# Patient Record
Sex: Female | Born: 1960 | Race: White | Hispanic: No | Marital: Married | Smoking: Never smoker
Health system: Southern US, Community
[De-identification: ages and names within clinical notes are randomized; demographics above are authoritative.]

## PROBLEM LIST (undated history)

## (undated) DIAGNOSIS — E538 Deficiency of other specified B group vitamins: Secondary | ICD-10-CM

## (undated) DIAGNOSIS — R569 Unspecified convulsions: Secondary | ICD-10-CM

## (undated) DIAGNOSIS — J309 Allergic rhinitis, unspecified: Secondary | ICD-10-CM

## (undated) DIAGNOSIS — E785 Hyperlipidemia, unspecified: Secondary | ICD-10-CM

## (undated) DIAGNOSIS — I8390 Asymptomatic varicose veins of unspecified lower extremity: Secondary | ICD-10-CM

## (undated) DIAGNOSIS — G5600 Carpal tunnel syndrome, unspecified upper limb: Secondary | ICD-10-CM

## (undated) HISTORY — DX: Carpal tunnel syndrome, unspecified upper limb: G56.00

## (undated) HISTORY — DX: Hyperlipidemia, unspecified: E78.5

## (undated) HISTORY — DX: Allergic rhinitis, unspecified: J30.9

## (undated) HISTORY — DX: Deficiency of other specified B group vitamins: E53.8

## (undated) HISTORY — DX: Unspecified convulsions: R56.9

## (undated) HISTORY — PX: VARICOSE VEIN SURGERY: SHX832

## (undated) HISTORY — DX: Asymptomatic varicose veins of unspecified lower extremity: I83.90

---

## 1998-05-19 ENCOUNTER — Other Ambulatory Visit: Admission: RE | Admit: 1998-05-19 | Discharge: 1998-05-19 | Payer: Self-pay | Admitting: Obstetrics & Gynecology

## 1999-06-07 ENCOUNTER — Other Ambulatory Visit: Admission: RE | Admit: 1999-06-07 | Discharge: 1999-06-07 | Payer: Self-pay | Admitting: Obstetrics & Gynecology

## 1999-09-21 ENCOUNTER — Other Ambulatory Visit: Admission: RE | Admit: 1999-09-21 | Discharge: 1999-09-21 | Payer: Self-pay | Admitting: Obstetrics & Gynecology

## 2000-10-16 ENCOUNTER — Other Ambulatory Visit: Admission: RE | Admit: 2000-10-16 | Discharge: 2000-10-16 | Payer: Self-pay | Admitting: Obstetrics & Gynecology

## 2001-12-20 ENCOUNTER — Other Ambulatory Visit: Admission: RE | Admit: 2001-12-20 | Discharge: 2001-12-20 | Payer: Self-pay | Admitting: Obstetrics & Gynecology

## 2003-01-12 ENCOUNTER — Other Ambulatory Visit: Admission: RE | Admit: 2003-01-12 | Discharge: 2003-01-12 | Payer: Self-pay | Admitting: Obstetrics & Gynecology

## 2005-04-04 ENCOUNTER — Other Ambulatory Visit: Admission: RE | Admit: 2005-04-04 | Discharge: 2005-04-04 | Payer: Self-pay | Admitting: Obstetrics & Gynecology

## 2009-01-11 ENCOUNTER — Ambulatory Visit: Payer: Self-pay | Admitting: Cardiology

## 2009-01-11 DIAGNOSIS — R0602 Shortness of breath: Secondary | ICD-10-CM | POA: Insufficient documentation

## 2009-05-05 DEATH — deceased

## 2010-06-24 ENCOUNTER — Ambulatory Visit
Admission: RE | Admit: 2010-06-24 | Discharge: 2010-06-24 | Payer: Self-pay | Source: Home / Self Care | Attending: Internal Medicine | Admitting: Internal Medicine

## 2010-06-24 ENCOUNTER — Other Ambulatory Visit: Payer: Self-pay | Admitting: Internal Medicine

## 2010-06-24 DIAGNOSIS — R569 Unspecified convulsions: Secondary | ICD-10-CM | POA: Insufficient documentation

## 2010-06-24 DIAGNOSIS — R5383 Other fatigue: Secondary | ICD-10-CM

## 2010-06-24 DIAGNOSIS — G4710 Hypersomnia, unspecified: Secondary | ICD-10-CM | POA: Insufficient documentation

## 2010-06-24 DIAGNOSIS — R5381 Other malaise: Secondary | ICD-10-CM | POA: Insufficient documentation

## 2010-06-24 DIAGNOSIS — G5600 Carpal tunnel syndrome, unspecified upper limb: Secondary | ICD-10-CM | POA: Insufficient documentation

## 2010-06-24 DIAGNOSIS — Z9189 Other specified personal risk factors, not elsewhere classified: Secondary | ICD-10-CM | POA: Insufficient documentation

## 2010-06-24 LAB — BASIC METABOLIC PANEL
BUN: 12 mg/dL (ref 6–23)
CO2: 27 mEq/L (ref 19–32)
Calcium: 9.3 mg/dL (ref 8.4–10.5)
Chloride: 107 mEq/L (ref 96–112)
Creatinine, Ser: 0.8 mg/dL (ref 0.4–1.2)
GFR: 85.38 mL/min (ref >60.00)
Glucose, Bld: 82 mg/dL (ref 70–99)
Potassium: 4.4 mEq/L (ref 3.5–5.1)
Sodium: 139 mEq/L (ref 135–145)

## 2010-06-24 LAB — CBC WITH DIFFERENTIAL/PLATELET
Basophils Absolute: 0 10*3/uL (ref 0.0–0.1)
Basophils Relative: 0.5 % (ref 0.0–3.0)
Eosinophils Absolute: 0.1 10*3/uL (ref 0.0–0.7)
Eosinophils Relative: 0.8 % (ref 0.0–5.0)
HCT: 39 % (ref 36.0–46.0)
Hemoglobin: 13.6 g/dL (ref 12.0–15.0)
Lymphocytes Relative: 24.9 % (ref 12.0–46.0)
Lymphs Abs: 2 10*3/uL (ref 0.7–4.0)
MCHC: 34.9 g/dL (ref 30.0–36.0)
MCV: 91 fl (ref 78.0–100.0)
Monocytes Absolute: 0.5 10*3/uL (ref 0.1–1.0)
Monocytes Relative: 6.3 % (ref 3.0–12.0)
Neutro Abs: 5.4 10*3/uL (ref 1.4–7.7)
Neutrophils Relative %: 67.5 % (ref 43.0–77.0)
Platelets: 370 10*3/uL (ref 150.0–400.0)
RBC: 4.29 Mil/uL (ref 3.87–5.11)
RDW: 12.6 % (ref 11.5–14.6)
WBC: 8 10*3/uL (ref 4.5–10.5)

## 2010-06-24 LAB — HEPATIC FUNCTION PANEL
ALT: 20 U/L (ref 0–35)
AST: 19 U/L (ref 0–37)
Albumin: 4 g/dL (ref 3.5–5.2)
Alkaline Phosphatase: 39 U/L (ref 39–117)
Bilirubin, Direct: 0.1 mg/dL (ref 0.0–0.3)
Total Bilirubin: 0.7 mg/dL (ref 0.3–1.2)
Total Protein: 6.9 g/dL (ref 6.0–8.3)

## 2010-06-24 LAB — TSH: TSH: 0.86 u[IU]/mL (ref 0.35–5.50)

## 2010-06-24 LAB — B12 AND FOLATE PANEL
Folate: 21.6 ng/mL (ref >5.9)
Vitamin B-12: 156 pg/mL — ABNORMAL LOW (ref 211–911)

## 2010-06-26 DIAGNOSIS — E538 Deficiency of other specified B group vitamins: Secondary | ICD-10-CM | POA: Insufficient documentation

## 2010-06-26 DIAGNOSIS — J309 Allergic rhinitis, unspecified: Secondary | ICD-10-CM | POA: Insufficient documentation

## 2010-06-29 ENCOUNTER — Ambulatory Visit
Admission: RE | Admit: 2010-06-29 | Discharge: 2010-06-29 | Payer: Self-pay | Source: Home / Self Care | Attending: Internal Medicine | Admitting: Internal Medicine

## 2010-07-07 NOTE — Assessment & Plan Note (Signed)
Summary: New / [employer] / #/ cd   Vital Signs:  Patient profile:   50 year old female Height:      66.5 inches (168.91 cm) Weight:      150.8 pounds (68.55 kg) BMI:     24.06 O2 Sat:      98 % on Room air Temp:     98.3 degrees F (36.83 degrees C) oral Pulse rate:   71 / minute BP sitting:   110 / 70  (left arm) Cuff size:   regular  Vitals Entered By: Orlan Leavens RMA (June 24, 2010 2:31 PM)  O2 Flow:  Room air CC: New patient Is Patient Diabetic? No Pain Assessment Patient in pain? no        Primary Care Provider:  Newt Lukes MD  CC:  New patient.  History of Present Illness: new pt to me and our practice, here to est care prior primary care needs have been through gyn, dr. Jennette Kettle  c/o fatigue onset 2 mo ago (04/2010) - course slightly improved in past 2 weeks but not yet back to her normal energy decribes as physical fatigue> motivational no depression symptoms or signs excess sleep - see next no med changes or diet changes - ?b12 defic as +FH same no HA, CP, abd pain, muscle weakness, vision change or new stressors  c/o hypersomnia - sleeps well at night >8h/sleep falls asleep "very easy" when not mentally engagaed (afterwork, after dinner, driving home) freq naps - never feels rested or alert describes prodromal sleep "i have to hurry dinner becuase i'll fall asleep before its done" - no snoring or awakening that pt is aware of - no med change s- no accidents or work problems related to sleeping no known narcolepsy or other sleeping disorder but ?for same - (remote sz as child with prodrome and numbness - ?relation to same - off phenobarb since age 66y w/o recurrent sz symptoms)    Preventive Screening-Counseling & Management  Alcohol-Tobacco     Alcohol drinks/day: <1     Alcohol Counseling: not indicated; use of alcohol is not excessive or problematic     Smoking Status: never     Tobacco Counseling: not indicated; no tobacco  use  Caffeine-Diet-Exercise     Does Patient Exercise: yes     Exercise Counseling: not indicated; exercise is adequate     Depression Counseling: not indicated; screening negative for depression  Safety-Violence-Falls     Seat Belt Counseling: not indicated; patient wears seat belts     Helmet Counseling: not indicated; patient wears helmet when riding bicycle/motocycle     Firearm Counseling: not indicated; uses recommended firearm safety measures     Violence Counseling: not indicated; no violence risk noted     Fall Risk Counseling: not indicated; no significant falls noted  Current Medications (verified): 1)  Aspirin 81 Mg Tbec (Aspirin) .... Once Daily 2)  Allegra Allergy 180 Mg Tabs (Fexofenadine Hcl) .... Take 1 By Mouth Once Daily 3)  Fish Oil 1000 Mg Caps (Omega-3 Fatty Acids) .... Take 1 By Mouth Once Daily 4)  Vitamin D 1000 Unit Tabs (Cholecalciferol) .... Take 1 By Mouth Once Daily 5)  Calcium 600 Mg Tabs (Calcium) .... Take 1 By Mouth Once Daily 6)  Loestrin 24 Fe 1-20 Mg-Mcg Tabs (Norethin Ace-Eth Estrad-Fe) .... Take 1 By Mouth Once Daily  Allergies (verified): 1)  ! Keflex 2)  ! Sulfa 3)  ! Augmentin  Past History:  Past Medical History:  remote sizure disorder - age 44y, off pheonbarb since age 63y Allergic rhinitis  MD roster: gyn - neal derm - turner  Past Surgical History: Denies surgical history  Family History: Family History Diabetes 1st degree relative (parent) Family History Hypertension (parent)  mom expired 08/2009 - dementia complications dad expired  Social History: Never Smoked, social/occ alcohol married, lives with spouse and 2 kids works as Psychologist, prison and probation services (part time) Smoking Status:  never Does Patient Exercise:  yes  Review of Systems       see HPI above. I have reviewed all other systems and they were negative.   Physical Exam  General:  fit, alert, well-developed, well-nourished, and cooperative to examination.     Head:  Normocephalic and atraumatic without obvious abnormalities. No apparent alopecia or balding. Eyes:  vision grossly intact; pupils equal, round and reactive to light.  conjunctiva and lids normal.    Ears:  normal pinnae bilaterally, without erythema, swelling, or tenderness to palpation. TMs clear, without effusion, or cerumen impaction. Hearing grossly normal bilaterally  Mouth:  teeth and gums in good repair; mucous membranes moist, without lesions or ulcers. oropharynx clear without exudate, no erythema.  Neck:  supple, full ROM, no masses, no thyromegaly; no thyroid nodules or tenderness. no JVD or carotid bruits.   Lungs:  normal respiratory effort, no intercostal retractions or use of accessory muscles; normal breath sounds bilaterally - no crackles and no wheezes.    Heart:  normal rate, regular rhythm, no murmur, and no rub. BLE without edema.  Abdomen:  soft, non-tender, normal bowel sounds, no distention; no masses and no appreciable hepatomegaly or splenomegaly.   Genitalia:  defer to gyn Msk:  No deformity or scoliosis noted of thoracic or lumbar spine.   Neurologic:  alert & oriented X3 and cranial nerves II-XII symetrically intact.  strength normal in all extremities, sensation intact to light touch, and gait normal. speech fluent without dysarthria or aphasia; follows commands with good comprehension.  Skin:  no rashes, vesicles, ulcers, or erythema. No nodules or irregularity to palpation.  Psych:  Oriented X3, memory intact for recent and remote, normally interactive, good eye contact, not anxious appearing, not depressed appearing, and not agitated.      Impression & Recommendations:  Problem # 1:  FATIGUE (ICD-780.79) nonspecific hx and exam - ?sleep disorder - see next lab screening now -  reassurance and plans to eval same reviewed with pt today, no med changes recommended at this time Orders: TLB-BMP (Basic Metabolic Panel-BMET) (80048-METABOL) TLB-CBC Platelet -  w/Differential (85025-CBCD) TLB-Hepatic/Liver Function Pnl (80076-HEPATIC) TLB-TSH (Thyroid Stimulating Hormone) (84443-TSH) TLB-B12 + Folate Pnl (11914_78295-A21/HYQ)  Problem # 2:  HYPERSOMNIA (ICD-780.54) easily falls asleep, never not tired - may be contrib to fatigue symptoms  refer to pulm for sleep eval and anticipated sleep study - Orders: Pulmonary Referral (Pulmonary)  Problem # 3:  ALLERGIC RHINITIS (ICD-477.9) on allegra for years -  no other sedating meds that may be contrib to sedation and fatigue issues Her updated medication list for this problem includes:    Allegra Allergy 180 Mg Tabs (Fexofenadine hcl) .[name]... Take 1 by mouth once daily  Complete Medication List: 1)  Aspirin 81 Mg Tbec (Aspirin) .... Once daily 2)  Allegra Allergy 180 Mg Tabs (Fexofenadine hcl) .... Take 1 by mouth once daily 3)  Fish Oil 1000 Mg Caps (Omega-3 fatty acids) .... Take 1 by mouth once daily 4)  Vitamin D 1000 Unit Tabs (Cholecalciferol) .... Take 1 by mouth  once daily 5)  Calcium 600 Mg Tabs (Calcium) .... Take 1 by mouth once daily 6)  Loestrin 24 Fe 1-20 Mg-mcg Tabs (Norethin ace-eth estrad-fe) .... Take 1 by mouth once daily  Patient Instructions: 1)  it was good to see you today. 2)  history and medications reviewed today 3)  test(s) ordered today - your results will be called to you after review in 48-72 hours from the time of test completion; if any changes need to be made or there are abnormal results, you will be  notified at that time 4)  we'll make referral to sleep specialist. Our office will contact you regarding this appointment once made.  5)  Please schedule a follow-up appointment in 3 months to review symptoms, call sooner if problems.    Orders Added: 1)  TLB-BMP (Basic Metabolic Panel-BMET) [80048-METABOL] 2)  TLB-CBC Platelet - w/Differential [85025-CBCD] 3)  TLB-Hepatic/Liver Function Pnl [80076-HEPATIC] 4)  TLB-TSH (Thyroid Stimulating Hormone) [84443-TSH] 5)   TLB-B12 + Folate Pnl [[id number]-B12/FOL] 6)  Pulmonary Referral [Pulmonary] 7)  New Patient Level III [16109]

## 2010-07-07 NOTE — Assessment & Plan Note (Signed)
Summary: PER LUCY B12 INJ--VL--STC  Nurse Visit   Allergies: 1)  ! Keflex 2)  ! Sulfa 3)  ! Augmentin  Medication Administration  Injection # 1:    Medication: Vit B12 1000 mcg    Diagnosis: VITAMIN B12 DEFICIENCY (ICD-266.2)    Route: IM    Site: L deltoid    Exp Date: 04/05/2012    Lot #: [id number]    Mfr: American Regent    Given by: Margaret Pyle, CMA (June 29, 2010 9:50 AM)  Orders Added: 1)  Admin of Therapeutic Inj  intramuscular or subcutaneous [96372] 2)  Vit B12 1000 mcg [J3420]

## 2010-07-08 ENCOUNTER — Ambulatory Visit (INDEPENDENT_AMBULATORY_CARE_PROVIDER_SITE_OTHER): Payer: Managed Care, Other (non HMO)

## 2010-07-08 ENCOUNTER — Encounter: Payer: Self-pay | Admitting: Internal Medicine

## 2010-07-08 DIAGNOSIS — E538 Deficiency of other specified B group vitamins: Secondary | ICD-10-CM

## 2010-07-13 NOTE — Assessment & Plan Note (Signed)
Summary: B-12 VAL  Nurse Visit   Allergies: 1)  ! Keflex 2)  ! Sulfa 3)  ! Augmentin  Medication Administration  Injection # 1:    Medication: Vit B12 1000 mcg    Diagnosis: VITAMIN B12 DEFICIENCY (ICD-266.2)    Route: IM    Site: LUOQ gluteus    Exp Date: 04/05/2012    Lot #: [id number]    Mfr: American Regent    Patient tolerated injection without complications    Given by: Margaret Pyle, CMA (July 08, 2010 10:39 AM)  Orders Added: 1)  Admin of Therapeutic Inj  intramuscular or subcutaneous [96372] 2)  Vit B12 1000 mcg [J3420]

## 2010-07-18 ENCOUNTER — Encounter: Payer: Self-pay | Admitting: Internal Medicine

## 2010-07-18 ENCOUNTER — Ambulatory Visit (INDEPENDENT_AMBULATORY_CARE_PROVIDER_SITE_OTHER): Payer: Managed Care, Other (non HMO)

## 2010-07-18 ENCOUNTER — Institutional Professional Consult (permissible substitution): Payer: Self-pay | Admitting: Pulmonary Disease

## 2010-07-18 DIAGNOSIS — E538 Deficiency of other specified B group vitamins: Secondary | ICD-10-CM

## 2010-07-25 ENCOUNTER — Ambulatory Visit: Payer: Managed Care, Other (non HMO)

## 2010-07-27 NOTE — Assessment & Plan Note (Signed)
Summary: PER PT B12 INJ D/T--VL  STC  Nurse Visit   Allergies: 1)  ! Keflex 2)  ! Sulfa 3)  ! Augmentin  Medication Administration  Injection # 1:    Medication: Vit B12 1000 mcg    Diagnosis: VITAMIN B12 DEFICIENCY (ICD-266.2)    Route: IM    Site: R deltoid    Exp Date: 04/2012    Lot #: [id number]    Mfr: American Regent    Patient tolerated injection without complications    Given by: Brenton Grills CMA (AAMA) (July 18, 2010 9:11 AM)  Orders Added: 1)  Admin of Therapeutic Inj  intramuscular or subcutaneous [96372] 2)  Vit B12 1000 mcg [J3420]

## 2010-08-02 ENCOUNTER — Institutional Professional Consult (permissible substitution) (INDEPENDENT_AMBULATORY_CARE_PROVIDER_SITE_OTHER): Payer: Managed Care, Other (non HMO) | Admitting: Pulmonary Disease

## 2010-08-02 ENCOUNTER — Institutional Professional Consult (permissible substitution): Payer: Self-pay | Admitting: Pulmonary Disease

## 2010-08-02 ENCOUNTER — Encounter: Payer: Self-pay | Admitting: Pulmonary Disease

## 2010-08-02 DIAGNOSIS — R5381 Other malaise: Secondary | ICD-10-CM

## 2010-08-16 NOTE — Assessment & Plan Note (Signed)
Summary: consult for hypersomnia    Copy to:  Rene Paci Primary Provider/Referring Provider:  Newt Lukes MD  CC:  Sleep Consult for tiredness during the day, snoring, and and falls asleep easily. [name]  History of Present Illness: The pt is a 50y/o female who I have been asked to see for possible osa.  +snoring, abnormal breathing pattern during sleep have been noted.  Sleeps btw 10p - 5-7am.  She feels rested upon arising, but has had mild "sleepiness issues" during the day for at least a few yrs now.  She describes fatigue >> sleepiness overall.  Her symptoms are not significant enough to interfere with work, and does not keep her from getting things done in the evening.  She rarely has sleepiness with driving except on occasion in the afternoons.  She did not have sleepiness issues during young adulthood.  She denies any RLS symptoms during the night, and no one has commented on kicking during sleep.  She did have a h/o seizures as a child, but has not  been an issue in adulthood.  Her epworth score today is abnormal at 16.  Medications Prior to Update: 1)  Aspirin 81 Mg Tbec (Aspirin) .... Once Daily 2)  Allegra Allergy 180 Mg Tabs (Fexofenadine Hcl) .... Take 1 By Mouth Once Daily 3)  Fish Oil 1000 Mg Caps (Omega-3 Fatty Acids) .... Take 1 By Mouth Once Daily 4)  Vitamin D 1000 Unit Tabs (Cholecalciferol) .... Take 1 By Mouth Once Daily 5)  Calcium 600 Mg Tabs (Calcium) .... Take 1 By Mouth Once Daily 6)  Loestrin 24 Fe 1-20 Mg-Mcg Tabs (Norethin Ace-Eth Estrad-Fe) .... Take 1 By Mouth Once Daily 7)  Cyanocobalamin 1000 Mcg/ml Soln (Cyanocobalamin) .... Im Weekly X 4 Weeks, Then Monthly  Allergies (verified): 1)  ! Keflex 2)  ! Sulfa 3)  ! Augmentin  Past History:  Past Medical History: Reviewed history from 06/24/2010 and no changes required. remote sizure disorder - age 50y, off pheonbarb since age 50y Allergic rhinitis  MD roster: gyn - neal derm - turner  Past  Surgical History: Reviewed history from 06/24/2010 and no changes required. Denies surgical history  Family History: Reviewed history from 06/24/2010 and no changes required. Family History Diabetes 1st degree relative (parent) Family History Hypertension (parent)  mom expired 08/2009 - dementia complications. also had heart disease, melanoma dad expired 37 years old.   Social History: Reviewed history from 06/24/2010 and no changes required. smoked very rarely x 1 year while in college.  social/occ alcohol married,  lives with spouse and 2 kids works as Psychologist, prison and probation services (part time)  Review of Systems       The patient complains of weight change.  The patient denies shortness of breath with activity, shortness of breath at rest, productive cough, non-productive cough, coughing up blood, chest pain, irregular heartbeats, acid heartburn, indigestion, loss of appetite, abdominal pain, difficulty swallowing, sore throat, tooth/dental problems, headaches, nasal congestion/difficulty breathing through nose, sneezing, itching, ear ache, anxiety, depression, hand/feet swelling, joint stiffness or pain, rash, change in color of mucus, and fever.    Vital Signs:  Patient profile:   50 year old female Height:      66.5 inches Weight:      153 pounds BMI:     24.41 O2 Sat:      99 % on Room air Temp:     98.8 degrees F oral Pulse rate:   77 / minute BP sitting:   110 /  76  (left arm) Cuff size:   regular  Vitals Entered By: Arman Filter LPN (August 02, 2010 1:36 PM)  O2 Flow:  Room air CC: Sleep Consult for tiredness during the day, snoring, and falls asleep easily.  Comments Medications reviewed with patient. Arman Filter LPN  August 02, 2010 1:45 PM    Physical Exam  General:  wd female in nad  Eyes:  PERRLA and EOMI.   Nose:  mildly narrowed, no purulence or obstruction Mouth:  elongation of uvula, soft palate normal  Neck:  no jvd, tmg, LN Lungs:  totally clear to  auscultation Heart:  rrr, no mrg Abdomen:  soft and nontender, bs+ Extremities:  no edema or cyanosis  pulses intact distally Neurologic:  alert and oriented, moves all 4    Impression & Recommendations:  Problem # 1:  FATIGUE (ICD-780.79)  the pt's history seems to be more c/w fatigue rather than true sleepiness.  She does not have the body habitus for osa, denies symptoms of RLS, and has not been noted to have unusual behaviors at night by her husband.  She does have an abnormal epworth score however.  I have told her the only way to determine whether she has a sleep disorder contributing to this is to do a sleep study.  She is unsure whether she wants to proceed, but will give it some thought.    Other Orders: Consultation Level IV (52841)  Patient Instructions: 1)  please continue to think about whether this is fatigue vs. sleepiness. 2)  let me know if you are having ongoing symptoms, and would like to proceed with sleep study.

## 2010-08-24 ENCOUNTER — Encounter: Payer: Self-pay | Admitting: *Deleted

## 2010-08-24 ENCOUNTER — Ambulatory Visit (INDEPENDENT_AMBULATORY_CARE_PROVIDER_SITE_OTHER): Payer: Managed Care, Other (non HMO) | Admitting: Internal Medicine

## 2010-08-24 DIAGNOSIS — E538 Deficiency of other specified B group vitamins: Secondary | ICD-10-CM

## 2010-08-24 MED ORDER — CYANOCOBALAMIN 1000 MCG/ML IJ SOLN
1000.0000 ug | Freq: Once | INTRAMUSCULAR | Status: AC
  Administered 2010-08-24: 1000 ug via INTRAMUSCULAR

## 2010-09-23 ENCOUNTER — Ambulatory Visit (INDEPENDENT_AMBULATORY_CARE_PROVIDER_SITE_OTHER): Payer: Managed Care, Other (non HMO) | Admitting: *Deleted

## 2010-09-23 DIAGNOSIS — E538 Deficiency of other specified B group vitamins: Secondary | ICD-10-CM

## 2010-09-23 MED ORDER — CYANOCOBALAMIN 1000 MCG/ML IJ SOLN
1000.0000 ug | Freq: Once | INTRAMUSCULAR | Status: AC
  Administered 2010-09-23: 1000 ug via INTRAMUSCULAR

## 2010-09-27 ENCOUNTER — Ambulatory Visit: Payer: Self-pay | Admitting: Internal Medicine

## 2010-09-28 ENCOUNTER — Ambulatory Visit (INDEPENDENT_AMBULATORY_CARE_PROVIDER_SITE_OTHER): Payer: Managed Care, Other (non HMO) | Admitting: Internal Medicine

## 2010-09-28 ENCOUNTER — Encounter: Payer: Self-pay | Admitting: Internal Medicine

## 2010-09-28 ENCOUNTER — Other Ambulatory Visit: Payer: Managed Care, Other (non HMO)

## 2010-09-28 VITALS — BP 100/62 | HR 73 | Temp 98.1°F | Ht 66.5 in | Wt 149.8 lb

## 2010-09-28 DIAGNOSIS — E538 Deficiency of other specified B group vitamins: Secondary | ICD-10-CM

## 2010-09-28 DIAGNOSIS — R5381 Other malaise: Secondary | ICD-10-CM

## 2010-09-28 DIAGNOSIS — G5600 Carpal tunnel syndrome, unspecified upper limb: Secondary | ICD-10-CM

## 2010-09-28 MED ORDER — CYANOCOBALAMIN 1000 MCG PO TABS: 1000.0000 ug | ORAL_TABLET | Freq: Every day | ORAL | Status: DC

## 2010-09-28 NOTE — Patient Instructions (Signed)
It was good to see you today. Test(s) ordered today to check for celiac disease. Your results will be called to you after review (48-72hours after test completion). If any changes need to be made, you will be notified at that time. 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

## 2010-09-29 ENCOUNTER — Telehealth: Payer: Self-pay | Admitting: Internal Medicine

## 2010-09-29 NOTE — Assessment & Plan Note (Signed)
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

## 2010-09-29 NOTE — Assessment & Plan Note (Signed)
?  malabsorbtion - check for possible celiac dz - TTGab - given ongoing fatigue Many neuropathy and fatigue symptoms improving Continue IM qmo and add oral B12 supplements

## 2010-09-29 NOTE — Assessment & Plan Note (Signed)
B12 defic identified 06/2010, no other metabolic or nutitional prolems found - slowly improved Reassurance provided and labs/hx reviewed with pt today

## 2010-09-29 NOTE — Progress Notes (Signed)
  Subjective:    Patient ID: Mandy Watkins, female    DOB: 10/28/1960, 50 y.o.   MRN: [id number]  HPI  Here for follow up Seen 06/2010 for fatigue - dx with B12 deficiency - started on IM replacemnt for same - improved Also saw pulm sleep specialist - symptoms felt related to fatigue rather than sleeping problem (hypersomnia)  complains of "numb burning itching pain" on pad of 4th finger tip of right hand symptoms present >38mo, intermittent course and wax/wane intensity of symptoms Denies precipitating injury or trauma to area - no weaknes, skin change or rash Prior numbness in feet improved since starting B12  Allergic rhinitis - uses OTC meds for symptoms control  Past Medical History  Diagnosis Date  . VITAMIN B12 DEFICIENCY dx 06/26/2010  . ALLERGIC RHINITIS   . CARPAL TUNNEL SYNDROME, RIGHT    Review of Systems  Constitutional: Negative for unexpected weight change.  Respiratory: Negative for shortness of breath.   Cardiovascular: Negative for chest pain.  Gastrointestinal: Negative for abdominal pain, diarrhea and abdominal distention.  Neurological: Negative for seizures, syncope and headaches.       Objective:   Physical Exam  Constitutional: She is oriented to person, place, and time. She appears well-developed and well-nourished.  Cardiovascular: Normal rate, regular rhythm and normal heart sounds.   Pulmonary/Chest: Effort normal and breath sounds normal. No respiratory distress.  Musculoskeletal: Normal range of motion.  Neurological: She is alert and oriented to person, place, and time. No cranial nerve deficit. Coordination normal.  Psychiatric: She has a normal mood and affect. Her behavior is normal.  BP 100/62  Pulse 73  Temp(Src) 98.1 F (36.7 C) (Oral)  Ht 5' 6.5" (1.689 m)  Wt 149 lb 12.8 oz (67.949 kg)  BMI 23.82 kg/m2  SpO2 98% Lab Results  Component Value Date   WBC 8.0 06/24/2010   HGB 13.6 06/24/2010   HCT 39.0 06/24/2010   PLT 370.0  06/24/2010   ALT 20 06/24/2010   AST 19 06/24/2010   NA 139 06/24/2010   K 4.4 06/24/2010   CL 107 06/24/2010   CREATININE 0.8 06/24/2010   BUN 12 06/24/2010   CO2 27 06/24/2010   TSH 0.86 06/24/2010   Lab Results  Component Value Date   VITAMINB12 156* 06/24/2010       Assessment & Plan:  Fatigue with B12 deficency - dx 06/2010 - improving but not resolved with replacement - advised oral B12 as well as IM q mo - will also check for celiac dz (occ bloat, rash) Fingertip parasthesia - ?glomus tumor - advised education and hand eval if symptoms affecting life quality or fx - pt will consider Otherwise, See problem list. Medications and labs reviewed today.

## 2010-09-29 NOTE — Telephone Encounter (Signed)
Please call patient - normal test results, no gluten antibodies - no evidence for celiac dz. Would still consider trying gluten free diet to see if diet change helps her fatigue. No medication changes recommended. Thanks.

## 2010-09-30 NOTE — Telephone Encounter (Signed)
Called pt no ansew LMOM RTC.Marland KitchenMarland Kitchen4/27/12@2 :19pm/LMB

## 2010-10-03 NOTE — Telephone Encounter (Signed)
Called pt no ansew LMOM RTC ASAP concerning labs.Marland KitchenMarland Kitchen4/30/12@12 :55pm/LMB

## 2010-10-03 NOTE — Telephone Encounter (Signed)
Pt return call back gave results concening labs.Mandy KitchenMarland Kitchen4/30/12@1 :55pm/LMB

## 2010-10-24 ENCOUNTER — Ambulatory Visit (INDEPENDENT_AMBULATORY_CARE_PROVIDER_SITE_OTHER): Payer: Managed Care, Other (non HMO)

## 2010-10-24 DIAGNOSIS — E538 Deficiency of other specified B group vitamins: Secondary | ICD-10-CM

## 2010-10-24 DIAGNOSIS — Z111 Encounter for screening for respiratory tuberculosis: Secondary | ICD-10-CM

## 2010-10-24 MED ORDER — CYANOCOBALAMIN 1000 MCG/ML IJ SOLN
1000.0000 ug | Freq: Once | INTRAMUSCULAR | Status: AC
  Administered 2010-10-24: 1000 ug via INTRAMUSCULAR

## 2010-10-26 LAB — TB SKIN TEST: TB Skin Test: NEGATIVE mm

## 2010-10-27 ENCOUNTER — Encounter: Payer: Self-pay | Admitting: Internal Medicine

## 2010-11-22 ENCOUNTER — Encounter: Payer: Self-pay | Admitting: Internal Medicine

## 2010-11-23 ENCOUNTER — Ambulatory Visit (INDEPENDENT_AMBULATORY_CARE_PROVIDER_SITE_OTHER): Payer: Managed Care, Other (non HMO)

## 2010-11-23 DIAGNOSIS — E538 Deficiency of other specified B group vitamins: Secondary | ICD-10-CM

## 2010-11-23 MED ORDER — CYANOCOBALAMIN 1000 MCG/ML IJ SOLN
1000.0000 ug | Freq: Once | INTRAMUSCULAR | Status: AC
  Administered 2010-11-23: 1000 ug via INTRAMUSCULAR

## 2010-12-23 ENCOUNTER — Ambulatory Visit (INDEPENDENT_AMBULATORY_CARE_PROVIDER_SITE_OTHER): Payer: Managed Care, Other (non HMO) | Admitting: *Deleted

## 2010-12-23 DIAGNOSIS — E538 Deficiency of other specified B group vitamins: Secondary | ICD-10-CM

## 2010-12-23 MED ORDER — CYANOCOBALAMIN 1000 MCG/ML IJ SOLN
1000.0000 ug | Freq: Once | INTRAMUSCULAR | Status: AC
  Administered 2010-12-23: 1000 ug via INTRAMUSCULAR

## 2011-01-23 ENCOUNTER — Ambulatory Visit (INDEPENDENT_AMBULATORY_CARE_PROVIDER_SITE_OTHER): Payer: Managed Care, Other (non HMO) | Admitting: *Deleted

## 2011-01-23 ENCOUNTER — Telehealth: Payer: Self-pay | Admitting: *Deleted

## 2011-01-23 DIAGNOSIS — E538 Deficiency of other specified B group vitamins: Secondary | ICD-10-CM

## 2011-01-23 MED ORDER — CYANOCOBALAMIN 1000 MCG/ML IJ SOLN
1000.0000 ug | Freq: Once | INTRAMUSCULAR | Status: AC
  Administered 2011-01-23: 1000 ug via INTRAMUSCULAR

## 2011-01-23 NOTE — Telephone Encounter (Signed)
Pt came in for B12 inj today. She is inquiring as to when she should have her b12 level checked. She states it was last checked in January. Please inform pt when she needs this checked.

## 2011-01-23 NOTE — Telephone Encounter (Signed)
Left mess for patient to call back.  

## 2011-01-23 NOTE — Telephone Encounter (Signed)
We can check this next month before pt has b12 shot administered - i have placed order for lab on 9/24 - pt should have lab drawn, then get the b12 shot - thanks

## 2011-01-31 NOTE — Telephone Encounter (Signed)
Left detailed mess informing pt of below.  

## 2011-02-23 ENCOUNTER — Other Ambulatory Visit (INDEPENDENT_AMBULATORY_CARE_PROVIDER_SITE_OTHER): Payer: Managed Care, Other (non HMO)

## 2011-02-23 ENCOUNTER — Ambulatory Visit (INDEPENDENT_AMBULATORY_CARE_PROVIDER_SITE_OTHER): Payer: Managed Care, Other (non HMO)

## 2011-02-23 DIAGNOSIS — E538 Deficiency of other specified B group vitamins: Secondary | ICD-10-CM

## 2011-02-23 MED ORDER — CYANOCOBALAMIN 1000 MCG/ML IJ SOLN
1000.0000 ug | Freq: Once | INTRAMUSCULAR | Status: AC
  Administered 2011-02-23: 1000 ug via INTRAMUSCULAR

## 2011-03-27 ENCOUNTER — Other Ambulatory Visit: Payer: Self-pay | Admitting: Internal Medicine

## 2011-03-27 ENCOUNTER — Encounter: Payer: Self-pay | Admitting: Internal Medicine

## 2011-03-27 ENCOUNTER — Ambulatory Visit (INDEPENDENT_AMBULATORY_CARE_PROVIDER_SITE_OTHER): Payer: Managed Care, Other (non HMO) | Admitting: Internal Medicine

## 2011-03-27 ENCOUNTER — Ambulatory Visit: Payer: Managed Care, Other (non HMO)

## 2011-03-27 ENCOUNTER — Telehealth: Payer: Self-pay | Admitting: *Deleted

## 2011-03-27 ENCOUNTER — Other Ambulatory Visit (INDEPENDENT_AMBULATORY_CARE_PROVIDER_SITE_OTHER): Payer: Managed Care, Other (non HMO)

## 2011-03-27 VITALS — BP 102/62 | HR 71 | Temp 97.9°F | Ht 66.5 in | Wt 147.1 lb

## 2011-03-27 DIAGNOSIS — Z0000 Encounter for general adult medical examination without abnormal findings: Secondary | ICD-10-CM

## 2011-03-27 DIAGNOSIS — Z23 Encounter for immunization: Secondary | ICD-10-CM

## 2011-03-27 DIAGNOSIS — E538 Deficiency of other specified B group vitamins: Secondary | ICD-10-CM

## 2011-03-27 DIAGNOSIS — Z1211 Encounter for screening for malignant neoplasm of colon: Secondary | ICD-10-CM

## 2011-03-27 DIAGNOSIS — R55 Syncope and collapse: Secondary | ICD-10-CM

## 2011-03-27 LAB — HEPATIC FUNCTION PANEL
AST: 28 U/L (ref 0–37)
Alkaline Phosphatase: 38 U/L — ABNORMAL LOW (ref 39–117)
Bilirubin, Direct: 0.2 mg/dL (ref 0.0–0.3)
Total Bilirubin: 1 mg/dL (ref 0.3–1.2)

## 2011-03-27 LAB — BASIC METABOLIC PANEL
BUN: 10 mg/dL (ref 6–23)
CO2: 25 mEq/L (ref 19–32)
Calcium: 9.2 mg/dL (ref 8.4–10.5)
Creatinine, Ser: 0.8 mg/dL (ref 0.4–1.2)
Glucose, Bld: 95 mg/dL (ref 70–99)
Sodium: 137 mEq/L (ref 135–145)

## 2011-03-27 LAB — CBC WITH DIFFERENTIAL/PLATELET
Basophils Absolute: 0 10*3/uL (ref 0.0–0.1)
Eosinophils Absolute: 0 10*3/uL (ref 0.0–0.7)
Hemoglobin: 14.5 g/dL (ref 12.0–15.0)
Lymphocytes Relative: 22.3 % (ref 12.0–46.0)
MCHC: 34.7 g/dL (ref 30.0–36.0)
Neutro Abs: 4.4 10*3/uL (ref 1.4–7.7)
Platelets: 353 10*3/uL (ref 150.0–400.0)
RDW: 12.7 % (ref 11.5–14.6)

## 2011-03-27 LAB — LIPID PANEL: Total CHOL/HDL Ratio: 3

## 2011-03-27 MED ORDER — CYANOCOBALAMIN 1000 MCG/ML IJ SOLN
1000.0000 ug | Freq: Once | INTRAMUSCULAR | Status: AC
  Administered 2011-03-27: 1000 ug via INTRAMUSCULAR

## 2011-03-27 NOTE — Patient Instructions (Signed)
It was good to see you today. Immunizations updated today + B12 shot done Test(s) ordered today. Your results will be called to you after review (48-72hours after test completion) and form completed for work pickup as discussed. If any changes need to be made, you will be notified at that time. we'll make referral for cardiac ultrasound due to "near" passing out symptoms . Our office will contact you regarding appointment(s) once made. Also refer for colonoscopy (age >50) -

## 2011-03-27 NOTE — Progress Notes (Signed)
Subjective:    Patient ID: Mandy Watkins, female    DOB: October 02, 1960, 50 y.o.   MRN: [id number]  HPI  patient is here today for annual physical. Patient feels well today.  Complains of nearly passing out "near syncope ". Onset event 5 days ago while driving History of same including tightness feeling in chest with shortness of breath Status post cardiac evaluation for same in 2010, felt to be anxiety related after negative eval No recurrence of symptoms in past 6 months until last week Sudden onset while sitting at rest, driving Describes chest tightness with "wind blowing in chest" feeling -  Symptoms radiated up neck into the head and caused blackening of vision Symptoms lasted 2-3 minutes then spontaneously resolved Denies shortness of breath, pain or current anxiety/stress  Also reviewed chronic medical issues:  B12 deficiency - dx 06/2010 - started on IM replacement for same - improved numbness and tingling sensation, improved fatigue. Also saw pulm sleep specialist - symptoms felt related to fatigue rather than sleeping problem (hypersomnia)   Past Medical History  Diagnosis Date  . VITAMIN B12 DEFICIENCY dx 06/26/2010  . ALLERGIC RHINITIS   . CARPAL TUNNEL SYNDROME, RIGHT   . SEIZURE DISORDER    Family History  Problem Relation Age of Onset  . Diabetes Mother   . Hypertension Mother   . Diabetes Father   . Hypertension Father    History  Substance Use Topics  . Smoking status: Never Smoker   . 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 Alcohol Use: Yes     Social    Review of Systems  Constitutional: Negative for unexpected weight change.  Respiratory: Negative for shortness of breath.   Cardiovascular: Negative for chest pain.  Gastrointestinal: Negative for abdominal pain, diarrhea and abdominal distention.  Neurological: Negative for seizures, syncope and headaches.  No  other specific complaints in a complete review of systems (except as listed in HPI above).      Objective:   Physical ExamBP 102/62  Pulse 71  Temp(Src) 97.9 F (36.6 C) (Oral)  Ht 5' 6.5" (1.689 m)  Wt 147 lb 1.9 oz (66.733 kg)  BMI 23.39 kg/m2  SpO2 98% Wt Readings from Last 3 Encounters:  03/27/11 147 lb 1.9 oz (66.733 kg)  09/28/10 149 lb 12.8 oz (67.949 kg)  08/02/10 153 lb (69.4 kg)   Constitutional: She appears well-developed and well-nourished. No distress.  HENT: Head: Normocephalic and atraumatic. Ears: B TMs ok, no erythema or effusion; Nose: Nose normal.  Mouth/Throat: Oropharynx is clear and moist. No oropharyngeal exudate.  Eyes: Conjunctivae and EOM are normal. Pupils are equal, round, and reactive to light. No scleral icterus.  Neck: Normal range of motion. Neck supple. No JVD present. No thyromegaly present.  Cardiovascular: Normal rate, regular rhythm and normal heart sounds.  No murmur heard. No BLE edema. Pulmonary/Chest: Effort normal and breath sounds normal. No respiratory distress. She has no wheezes.  Abdominal: Soft. Bowel sounds are normal. She exhibits no distension. There is no tenderness. no masses GU: defer to gyn Musculoskeletal: Normal range of motion, no joint effusions. No gross deformities Neurological: She is alert and oriented to person, place, and time. No cranial nerve deficit. Coordination normal.  Skin: Skin is warm and dry. No rash noted. No erythema.  Psychiatric: She has a normal mood and affect. Her behavior is normal. Judgment and thought content normal.  Lab Results  Component Value Date   WBC 8.0 06/24/2010   HGB 13.6 06/24/2010   HCT 39.0 06/24/2010   PLT 370.0 06/24/2010   ALT 20 06/24/2010   AST 19 06/24/2010   NA 139 06/24/2010   K 4.4 06/24/2010   CL 107 06/24/2010   CREATININE 0.8 06/24/2010   BUN 12 06/24/2010   CO2 27 06/24/2010   TSH 0.86 06/24/2010   Lab Results  Component Value Date   VITAMINB12 600 02/23/2011   EKG:  Sinus rhythm at 75 beats per minute. No ischemic changes or arrhythmias     Assessment & Plan:   CPX- v70.0 - Patient has been counseled on age-appropriate routine health concerns for screening and prevention. These are reviewed and up-to-date. Immunizations are up-to-date or declined. Labs ordered and will be reviewed. Will complete forms for health screen for employer once labs returned  Near syncope. History of same with negative cardiac evaluation 2010. Recurrence of events 5 days ago with increasing severity of symptoms. EKG today unremarkable for acute event but will refer for echocardiogram to evaluate for structural or valvular changes. Exam and vitals stable today, reassurance provided.

## 2011-03-27 NOTE — Telephone Encounter (Signed)
Pt walk in requesting to have labs done prior to appt today @ 1:30. Pt has a health form from work that is requesting her cholesterol levels. Pt had labs done back in 06/24/10, but know lipid panel was done....03/27/11@9 :15am/LMB

## 2011-03-27 NOTE — Telephone Encounter (Signed)
Pt was notified md entered labs went to have blood work done...03/27/11@9 :58am/LMB

## 2011-03-27 NOTE — Telephone Encounter (Signed)
Lipids ordered as well as full CPX labs (will bill as CPX visit this afternoon)

## 2011-03-28 ENCOUNTER — Encounter: Payer: Self-pay | Admitting: Internal Medicine

## 2011-03-31 ENCOUNTER — Encounter: Payer: Self-pay | Admitting: Gastroenterology

## 2011-04-04 ENCOUNTER — Ambulatory Visit (HOSPITAL_COMMUNITY): Payer: Managed Care, Other (non HMO) | Attending: Cardiology

## 2011-04-04 DIAGNOSIS — R55 Syncope and collapse: Secondary | ICD-10-CM

## 2011-04-04 DIAGNOSIS — R079 Chest pain, unspecified: Secondary | ICD-10-CM | POA: Insufficient documentation

## 2011-04-04 DIAGNOSIS — R0989 Other specified symptoms and signs involving the circulatory and respiratory systems: Secondary | ICD-10-CM | POA: Insufficient documentation

## 2011-04-04 DIAGNOSIS — R0609 Other forms of dyspnea: Secondary | ICD-10-CM | POA: Insufficient documentation

## 2011-04-12 ENCOUNTER — Ambulatory Visit (AMBULATORY_SURGERY_CENTER): Payer: Managed Care, Other (non HMO) | Admitting: *Deleted

## 2011-04-12 ENCOUNTER — Encounter: Payer: Self-pay | Admitting: Gastroenterology

## 2011-04-12 VITALS — Ht 66.5 in | Wt 148.6 lb

## 2011-04-12 DIAGNOSIS — Z1211 Encounter for screening for malignant neoplasm of colon: Secondary | ICD-10-CM

## 2011-04-12 MED ORDER — PEG-KCL-NACL-NASULF-NA ASC-C 100 G PO SOLR: ORAL | Status: DC

## 2011-04-26 ENCOUNTER — Encounter: Payer: Self-pay | Admitting: Gastroenterology

## 2011-04-26 ENCOUNTER — Ambulatory Visit (AMBULATORY_SURGERY_CENTER): Payer: Managed Care, Other (non HMO) | Admitting: Gastroenterology

## 2011-04-26 DIAGNOSIS — K5730 Diverticulosis of large intestine without perforation or abscess without bleeding: Secondary | ICD-10-CM

## 2011-04-26 DIAGNOSIS — Z1211 Encounter for screening for malignant neoplasm of colon: Secondary | ICD-10-CM

## 2011-04-26 MED ORDER — SODIUM CHLORIDE 0.9 % IV SOLN: 500.0000 mL | INTRAVENOUS | Status: DC

## 2011-04-26 NOTE — Patient Instructions (Signed)
Please refer to your blue and neon green sheets for instructions regarding diet and activity for the rest of today.  You may resume your medications as you would normally take them.   Diverticulosis Diverticulosis is a common condition that develops when small pouches (diverticula) form in the wall of the colon. The risk of diverticulosis increases with age. It happens more often in people who eat a low-fiber diet. Most individuals with diverticulosis have no symptoms. Those individuals with symptoms usually experience abdominal pain, constipation, or loose stools (diarrhea). HOME CARE INSTRUCTIONS   Increase the amount of fiber in your diet as directed by your caregiver or dietician. This may reduce symptoms of diverticulosis.   Your caregiver may recommend taking a dietary fiber supplement.   Drink at least 6 to 8 glasses of water each day to prevent constipation.   Try not to strain when you have a bowel movement.   Your caregiver may recommend avoiding nuts and seeds to prevent complications, although this is still an uncertain benefit.   Only take over-the-counter or prescription medicines for pain, discomfort, or fever as directed by your caregiver.  FOODS WITH HIGH FIBER CONTENT INCLUDE:  Fruits. Apple, peach, pear, tangerine, raisins, prunes.   Vegetables. Brussels sprouts, asparagus, broccoli, cabbage, carrot, cauliflower, romaine lettuce, spinach, summer squash, tomato, winter squash, zucchini.   Starchy Vegetables. Baked beans, kidney beans, lima beans, split peas, lentils, potatoes (with skin).   Grains. Whole wheat bread, brown rice, bran flake cereal, plain oatmeal, white rice, shredded wheat, bran muffins.  SEEK IMMEDIATE MEDICAL CARE IF:   You develop increasing pain or severe bloating.   You have an oral temperature above 102 F (38.9 C), not controlled by medicine.   You develop vomiting or bowel movements that are bloody or black.  Document Released: 02/17/2004  Document Revised: 02/01/2011 Document Reviewed: 10/20/2009 [employer] Patient Information 2012 [employer]. 

## 2011-04-26 NOTE — Progress Notes (Signed)
Redness was noted on patient's right wrist and forearm following the pattern of her veins after final dose of Benadryl. No swelling. IV running well. Sped up IV fluids and redness started lightening to a pink color. Patient shows no signs of discomfort. Continuing IV fluids throughout recovery time.  1152-Pt IV discontinued per protocol. Redness resolved. No c/o on pain. No tenderness/warmth to touch. Site WNL  Patient did not experience any of the following events: a burn prior to discharge; a fall within the facility; wrong site/side/patient/procedure/implant event; or a hospital transfer or hospital admission upon discharge from the facility. [phone])  Patient did not have preoperative order for IV antibiotic SSI prophylaxis. [phone])

## 2011-05-01 ENCOUNTER — Ambulatory Visit: Payer: Managed Care, Other (non HMO) | Admitting: *Deleted

## 2011-05-01 ENCOUNTER — Telehealth: Payer: Self-pay | Admitting: *Deleted

## 2011-05-01 DIAGNOSIS — E538 Deficiency of other specified B group vitamins: Secondary | ICD-10-CM

## 2011-05-01 MED ORDER — CYANOCOBALAMIN 1000 MCG/ML IJ SOLN
1000.0000 ug | Freq: Once | INTRAMUSCULAR | Status: AC
  Administered 2011-05-01: 1000 ug via INTRAMUSCULAR

## 2011-05-01 NOTE — Telephone Encounter (Signed)
No answer. Message left on voicemail. 

## 2011-06-01 ENCOUNTER — Ambulatory Visit (INDEPENDENT_AMBULATORY_CARE_PROVIDER_SITE_OTHER): Payer: Managed Care, Other (non HMO)

## 2011-06-01 ENCOUNTER — Telehealth: Payer: Self-pay

## 2011-06-01 DIAGNOSIS — E538 Deficiency of other specified B group vitamins: Secondary | ICD-10-CM

## 2011-06-01 MED ORDER — CYANOCOBALAMIN 1000 MCG/ML IJ SOLN
1000.0000 ug | Freq: Once | INTRAMUSCULAR | Status: AC
  Administered 2011-06-01: 1000 ug via INTRAMUSCULAR

## 2011-06-01 MED ORDER — "INSULIN SYRINGE-NEEDLE U-100 25G X 1"" 1 ML MISC": 1.0000 | Status: DC

## 2011-06-01 MED ORDER — CYANOCOBALAMIN 1000 MCG/ML IJ SOLN: 1000.0000 ug | INTRAMUSCULAR | Status: DC

## 2011-06-01 NOTE — Telephone Encounter (Signed)
Pt requests Rx for B-12 and syringes so her husband can start administrating injections. Per pt, spouse was in the [employer] and is able to give injections. Okay to Rx?

## 2011-06-01 NOTE — Telephone Encounter (Signed)
Yes thanks 

## 2011-06-06 LAB — HM PAP SMEAR

## 2011-06-06 LAB — HM MAMMOGRAPHY

## 2011-12-01 ENCOUNTER — Other Ambulatory Visit: Payer: Self-pay | Admitting: Internal Medicine

## 2012-02-14 ENCOUNTER — Telehealth: Payer: Self-pay | Admitting: *Deleted

## 2012-02-14 DIAGNOSIS — Z0000 Encounter for general adult medical examination without abnormal findings: Secondary | ICD-10-CM

## 2012-02-14 NOTE — Telephone Encounter (Signed)
Message copied by Merrilyn Puma on Wed Feb 14, 2012  4:36 PM ------      Message from: Oneal Grout      Created: Wed Feb 14, 2012  4:29 PM       Please put in CPE labs, pt has appt 04/03/12 Thanks

## 2012-02-14 NOTE — Telephone Encounter (Signed)
CPE labs entered.  

## 2012-04-03 ENCOUNTER — Encounter: Payer: Self-pay | Admitting: Internal Medicine

## 2012-04-03 ENCOUNTER — Ambulatory Visit (INDEPENDENT_AMBULATORY_CARE_PROVIDER_SITE_OTHER): Payer: Managed Care, Other (non HMO) | Admitting: Internal Medicine

## 2012-04-03 ENCOUNTER — Other Ambulatory Visit (INDEPENDENT_AMBULATORY_CARE_PROVIDER_SITE_OTHER): Payer: Managed Care, Other (non HMO)

## 2012-04-03 VITALS — BP 118/76 | HR 78 | Temp 98.4°F | Ht 66.0 in | Wt 152.0 lb

## 2012-04-03 DIAGNOSIS — Z23 Encounter for immunization: Secondary | ICD-10-CM

## 2012-04-03 DIAGNOSIS — Z0000 Encounter for general adult medical examination without abnormal findings: Secondary | ICD-10-CM

## 2012-04-03 LAB — LIPID PANEL
Cholesterol: 195 mg/dL (ref 0–200)
HDL: 64.1 mg/dL (ref >39.00)
LDL Cholesterol: 114 mg/dL — ABNORMAL HIGH (ref 0–99)
Triglycerides: 87 mg/dL (ref 0.0–149.0)
VLDL: 17.4 mg/dL (ref 0.0–40.0)

## 2012-04-03 LAB — BASIC METABOLIC PANEL
BUN: 9 mg/dL (ref 6–23)
Creatinine, Ser: 0.9 mg/dL (ref 0.4–1.2)
GFR: 73.52 mL/min (ref >60.00)
Glucose, Bld: 84 mg/dL (ref 70–99)
Potassium: 4.2 mEq/L (ref 3.5–5.1)

## 2012-04-03 LAB — HEPATIC FUNCTION PANEL
AST: 22 U/L (ref 0–37)
Albumin: 3.8 g/dL (ref 3.5–5.2)

## 2012-04-03 LAB — URINALYSIS, ROUTINE W REFLEX MICROSCOPIC
Ketones, ur: NEGATIVE
Total Protein, Urine: NEGATIVE
Urine Glucose: NEGATIVE
Urobilinogen, UA: 0.2 (ref 0.0–1.0)
pH: 7 (ref 5.0–8.0)

## 2012-04-03 LAB — CBC WITH DIFFERENTIAL/PLATELET
Basophils Absolute: 0 10*3/uL (ref 0.0–0.1)
Eosinophils Relative: 1.3 % (ref 0.0–5.0)
Lymphocytes Relative: 23 % (ref 12.0–46.0)
Monocytes Relative: 6.6 % (ref 3.0–12.0)
Neutrophils Relative %: 68.6 % (ref 43.0–77.0)
Platelets: 330 10*3/uL (ref 150.0–400.0)
RDW: 12.2 % (ref 11.5–14.6)
WBC: 8.2 10*3/uL (ref 4.5–10.5)

## 2012-04-03 LAB — TSH: TSH: 1.28 u[IU]/mL (ref 0.35–5.50)

## 2012-04-03 NOTE — Progress Notes (Signed)
Subjective:    Patient ID: Mandy Watkins, female    DOB: 02-15-1961, 51 y.o.   MRN: [id number]  HPI  patient is here today for annual physical. Patient feels well today.  Also reviewed chronic medical issues:  B12 deficiency - dx 06/2010 - started on IM replacement for same - improved numbness and tingling sensation, improved fatigue.    Past Medical History  Diagnosis Date  . VITAMIN B12 DEFICIENCY dx 06/26/2010  . ALLERGIC RHINITIS   . CARPAL TUNNEL SYNDROME, RIGHT   . SEIZURE DISORDER     as child   Family History  Problem Relation Age of Onset  . Diabetes Mother   . Hypertension Mother   . Diabetes Father   . Hypertension Father   . Colon cancer Neg Hx    History  Substance Use Topics  . Smoking status: Never Smoker   . 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 Alcohol Use: Yes     Social    Review of Systems  Constitutional: Negative for unexpected weight change.  Respiratory: Negative for shortness of breath.   Cardiovascular: Negative for chest pain.  Gastrointestinal: Negative for abdominal pain, diarrhea and abdominal distention.  Neurological: Negative for seizures, syncope and headaches.  No other specific complaints in a complete review of systems (except as listed in HPI above).      Objective:   Physical Exam BP 118/76  Pulse 78  Temp 98.4 F (36.9 C) (Oral)  Ht 5\' 6"  (1.676 m)  Wt 152 lb (68.947 kg)  BMI 24.53 kg/m2  SpO2 99% Wt Readings from Last 3 Encounters:  04/03/12 152 lb (68.947 kg)  04/26/11 148 lb (67.132 kg)  04/12/11 148 lb 9.6 oz (67.405 kg)   Constitutional: She appears well-developed and well-nourished. No distress.  HENT: Head: Normocephalic and atraumatic. Ears: B TMs ok, no erythema or effusion; Nose: Nose normal. Mouth/Throat: Oropharynx is clear and moist. No oropharyngeal exudate.  Eyes: Conjunctivae and EOM are normal. Pupils are  equal, round, and reactive to light. No scleral icterus.  Neck: Normal range of motion. Neck supple. No JVD present. No thyromegaly present.  Cardiovascular: Normal rate, regular rhythm and normal heart sounds.  No murmur heard. No BLE edema. Pulmonary/Chest: Effort normal and breath sounds normal. No respiratory distress. She has no wheezes.  Abdominal: Soft. Bowel sounds are normal. She exhibits no distension. There is no tenderness. no masses GU: defer to gyn Musculoskeletal: Normal range of motion, no joint effusions. No gross deformities Neurological: She is alert and oriented to person, place, and time. No cranial nerve deficit. Coordination normal.  Skin: Skin is warm and dry. No rash noted. No erythema.  Psychiatric: She has a normal mood and affect. Her behavior is normal. Judgment and thought content normal.    Lab Results  Component Value Date   WBC 8.2 04/03/2012   HGB 13.1 04/03/2012   HCT 39.2 04/03/2012   PLT 330.0 04/03/2012   CHOL 195 04/03/2012   TRIG 87.0 04/03/2012   HDL 64.10 04/03/2012   LDLDIRECT 116.9 03/27/2011   ALT 20 04/03/2012   AST 22 04/03/2012   NA 136 04/03/2012   K 4.2 04/03/2012   CL 105 04/03/2012   CREATININE 0.9 04/03/2012   BUN 9 04/03/2012   CO2 25 04/03/2012   TSH 1.28 04/03/2012   Lab Results  Component Value Date   VITAMINB12 600 02/23/2011   EKG:  Sinus rhythm at 76 beats per minute. No ischemic changes or arrhythmias. No change from October 2012     Assessment & Plan:   CPX- v70.0 - Patient has been counseled on age-appropriate routine health concerns for screening and prevention. These are reviewed and up-to-date. Immunizations are up-to-date or declined. Labs ordered and reviewed.

## 2012-04-03 NOTE — Patient Instructions (Signed)
It was good to see you today. Health Maintenance reviewed - flu shot today - all recommended immunizations and age-appropriate screenings are up-to-date. We have reviewed your prior records including labs and tests today Please schedule followup in 12 months, call sooner if problems. Health Maintenance, Females A healthy lifestyle and preventative care can promote health and wellness.  Maintain regular health, dental, and eye exams.   Eat a healthy diet. Foods like vegetables, fruits, whole grains, low-fat dairy products, and lean protein foods contain the nutrients you need without too many calories. Decrease your intake of foods high in solid fats, added sugars, and salt. Get information about a proper diet from your caregiver, if necessary.   Regular physical exercise is one of the most important things you can do for your health. Most adults should get at least 150 minutes of moderate-intensity exercise (any activity that increases your heart rate and causes you to sweat) each week. In addition, most adults need muscle-strengthening exercises on 2 or more days a week.     Maintain a healthy weight. The body mass index (BMI) is a screening tool to identify possible weight problems. It provides an estimate of body fat based on height and weight. Your caregiver can help determine your BMI, and can help you achieve or maintain a healthy weight. For adults 20 years and older:   A BMI below 18.5 is considered underweight.   A BMI of 18.5 to 24.9 is normal.   A BMI of 25 to 29.9 is considered overweight.   A BMI of 30 and above is considered obese.   Maintain normal blood lipids and cholesterol by exercising and minimizing your intake of saturated fat. Eat a balanced diet with plenty of fruits and vegetables. Blood tests for lipids and cholesterol should begin at age 79 and be repeated every 5 years. If your lipid or cholesterol levels are high, you are over 50, or you are a high risk for heart  disease, you may need your cholesterol levels checked more frequently. Ongoing high lipid and cholesterol levels should be treated with medicines if diet and exercise are not effective.   If you smoke, find out from your caregiver how to quit. If you do not use tobacco, do not start.   If you are pregnant, do not drink alcohol. If you are breastfeeding, be very cautious about drinking alcohol. If you are not pregnant and choose to drink alcohol, do not exceed 1 drink per day. One drink is considered to be 12 ounces (355 mL) of beer, 5 ounces (148 mL) of wine, or 1.5 ounces (44 mL) of liquor.   Avoid use of street drugs. Do not share needles with anyone. Ask for help if you need support or instructions about stopping the use of drugs.   High blood pressure causes heart disease and increases the risk of stroke. Blood pressure should be checked at least every 1 to 2 years. Ongoing high blood pressure should be treated with medicines, if weight loss and exercise are not effective.   If you are 28 to 52 years old, ask your caregiver if you should take aspirin to prevent strokes.   Diabetes screening involves taking a blood sample to check your fasting blood sugar level. This should be done once every 3 years, after age 77, if you are within normal weight and without risk factors for diabetes. Testing should be considered at a younger age or be carried out more frequently if you are overweight and  have at least 1 risk factor for diabetes.   Breast cancer screening is essential preventative care for women. You should practice "breast self-awareness." This means understanding the normal appearance and feel of your breasts and may include breast self-examination. Any changes detected, no matter how small, should be reported to a caregiver. Women in their 33s and 30s should have a clinical breast exam (CBE) by a caregiver as part of a regular health exam every 1 to 3 years. After age 73, women should have a CBE  every year. Starting at age 46, women should consider having a mammogram (breast X-ray) every year. Women who have a family history of breast cancer should talk to their caregiver about genetic screening. Women at a high risk of breast cancer should talk to their caregiver about having an MRI and a mammogram every year.   The Pap test is a screening test for cervical cancer. Women should have a Pap test starting at age 81. Between ages 69 and 51, Pap tests should be repeated every 2 years. Beginning at age 73, you should have a Pap test every 3 years as long as the past 3 Pap tests have been normal. If you had a hysterectomy for a problem that was not cancer or a condition that could lead to cancer, then you no longer need Pap tests. If you are between ages 23 and 72, and you have had normal Pap tests going back 10 years, you no longer need Pap tests. If you have had past treatment for cervical cancer or a condition that could lead to cancer, you need Pap tests and screening for cancer for at least 20 years after your treatment. If Pap tests have been discontinued, risk factors (such as a new sexual partner) need to be reassessed to determine if screening should be resumed. Some women have medical problems that increase the chance of getting cervical cancer. In these cases, your caregiver may recommend more frequent screening and Pap tests.   The human papillomavirus (HPV) test is an additional test that may be used for cervical cancer screening. The HPV test looks for the virus that can cause the cell changes on the cervix. The cells collected during the Pap test can be tested for HPV. The HPV test could be used to screen women aged 60 years and older, and should be used in women of any age who have unclear Pap test results. After the age of 75, women should have HPV testing at the same frequency as a Pap test.   Colorectal cancer can be detected and often prevented. Most routine colorectal cancer screening  begins at the age of 1 and continues through age 61. However, your caregiver may recommend screening at an earlier age if you have risk factors for colon cancer. On a yearly basis, your caregiver may provide home test kits to check for hidden blood in the stool. Use of a small camera at the end of a tube, to directly examine the colon (sigmoidoscopy or colonoscopy), can detect the earliest forms of colorectal cancer. Talk to your caregiver about this at age 49, when routine screening begins. Direct examination of the colon should be repeated every 5 to 10 years through age 36, unless early forms of pre-cancerous polyps or small growths are found.   Hepatitis C blood testing is recommended for all people born from [birth year] through 1966 and any individual with known risks for hepatitis C.   Practice safe sex. Use condoms and avoid high-risk  sexual practices to reduce the spread of sexually transmitted infections (STIs). Sexually active women aged 10 and younger should be checked for Chlamydia, which is a common sexually transmitted infection. Older women with new or multiple partners should also be tested for Chlamydia. Testing for other STIs is recommended if you are sexually active and at increased risk.   Osteoporosis is a disease in which the bones lose minerals and strength with aging. This can result in serious bone fractures. The risk of osteoporosis can be identified using a bone density scan. Women ages 25 and over and women at risk for fractures or osteoporosis should discuss screening with their caregivers. Ask your caregiver whether you should be taking a calcium supplement or vitamin D to reduce the rate of osteoporosis.   Menopause can be associated with physical symptoms and risks. Hormone replacement therapy is available to decrease symptoms and risks. You should talk to your caregiver about whether hormone replacement therapy is right for you.   Use sunscreen with a sun protection factor (SPF)  of 30 or greater. Apply sunscreen liberally and repeatedly throughout the day. You should seek shade when your shadow is shorter than you. Protect yourself by wearing long sleeves, pants, a wide-brimmed hat, and sunglasses year round, whenever you are outdoors.   Notify your caregiver of new moles or changes in moles, especially if there is a change in shape or color. Also notify your caregiver if a mole is larger than the size of a pencil eraser.   Stay current with your immunizations.  Document Released: 12/05/2010 Document Revised: 08/14/2011 Document Reviewed: 12/05/2010 [facility] Patient Information 2013 [place], [place].

## 2012-04-30 ENCOUNTER — Other Ambulatory Visit: Payer: Self-pay | Admitting: Internal Medicine

## 2013-05-07 ENCOUNTER — Other Ambulatory Visit: Payer: Self-pay | Admitting: Internal Medicine

## 2013-05-13 ENCOUNTER — Other Ambulatory Visit: Payer: Self-pay | Admitting: Internal Medicine

## 2013-05-14 ENCOUNTER — Ambulatory Visit (INDEPENDENT_AMBULATORY_CARE_PROVIDER_SITE_OTHER): Payer: Managed Care, Other (non HMO) | Admitting: Internal Medicine

## 2013-05-14 ENCOUNTER — Encounter: Payer: Self-pay | Admitting: Internal Medicine

## 2013-05-14 VITALS — BP 118/72 | HR 72 | Temp 98.9°F | Wt 157.1 lb

## 2013-05-14 DIAGNOSIS — R233 Spontaneous ecchymoses: Secondary | ICD-10-CM

## 2013-05-14 DIAGNOSIS — I8390 Asymptomatic varicose veins of unspecified lower extremity: Secondary | ICD-10-CM

## 2013-05-14 NOTE — Assessment & Plan Note (Signed)
BLE symptoms - Prior ablation reviewed

## 2013-05-14 NOTE — Progress Notes (Signed)
   Subjective:    Patient ID: Liliana Cline, female    DOB: [birth year], 52 y.o.   MRN: [id number]  HPI  Concerned about discoloration on left forearm Denies trauma Currently with bruise appearance for one week Previously with streaking and redness at same site 3-4 weeks ago, photo of same demonstrated on her iphone  Past Medical History  Diagnosis Date  . VITAMIN B12 DEFICIENCY dx 06/26/2010  . ALLERGIC RHINITIS   . CARPAL TUNNEL SYNDROME, RIGHT   . SEIZURE DISORDER     as child    Review of Systems  Skin: Positive for color change. Negative for pallor and rash.  Hematological: Negative for adenopathy. Bruises/bleeds easily.       Objective:   Physical Exam BP 118/72  Pulse 72  Temp(Src) 98.9 F (37.2 C) (Oral)  Wt 157 lb 1.9 oz (71.269 kg)  SpO2 97%  Wt Readings from Last 3 Encounters:  05/14/13 157 lb 1.9 oz (71.269 kg)  04/03/12 152 lb (68.947 kg)  04/26/11 148 lb (67.132 kg)   Constitutional: She appears well-developed and well-nourished. No distress.  Musculoskeletal: Normal range of motion, no joint effusions. No gross deformities Neurological: She is alert and oriented to person, place, and time. No cranial nerve deficit. Coordination, balance, strength, speech and gait are normal.  Skin: mild soft tissue swelling with hematoma approximately 2 cm diameter on extensor surface of left forearm. No laceration. Palpable soft tissue mass that appears consistent with venous valve. Skin is warm and dry. No rash noted. No erythema.  Psychiatric: She has a normal mood and affect. Her behavior is normal. Judgment and thought content normal.          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

## 2013-05-14 NOTE — Patient Instructions (Addendum)
It was good to see you today.  Your annual flu shot was given and/or updated today.  The bruise is related to varicose veins in your arm, similar to those in your legs -suspect mild unrecalled trauma  Will refer for soft tissue ultrasound to further evaluate same and exclude underlying problem other than varicose veins -my office will call regarding this appointment, you will then be contacted with results after review  If continued symptoms or problems, please contact your varicose vein specialist for further treatment as needed  Varicose Veins Varicose veins are veins that have become enlarged and twisted. CAUSES This condition is the result of valves in the veins not working properly. Valves in the veins help return blood from the leg to the heart. If these valves are damaged, blood flows backwards and backs up into the veins in the leg near the skin. This causes the veins to become larger. People who are on their feet a lot, who are pregnant, or who are overweight are more likely to develop varicose veins. SYMPTOMS   Bulging, twisted-appearing, bluish veins, most commonly found on the legs.  Leg pain or a feeling of heaviness. These symptoms may be worse at the end of the day.  Leg swelling.  Skin color changes. DIAGNOSIS  Varicose veins can usually be diagnosed with an exam of your legs by your caregiver. He or she may recommend an ultrasound of your leg veins. TREATMENT  Most varicose veins can be treated at home.However, other treatments are available for people who have persistent symptoms or who want to treat the cosmetic appearance of the varicose veins. These include:  Laser treatment of very small varicose veins.  Medicine that is shot (injected) into the vein. This medicine hardens the walls of the vein and closes off the vein. This treatment is called sclerotherapy. Afterwards, you may need to wear clothing or bandages that apply pressure.  Surgery. HOME CARE  INSTRUCTIONS   Do not stand or sit in one position for long periods of time. Do not sit with your legs crossed. Rest with your legs raised during the day.  Wear elastic stockings or support hose. Do not wear other tight, encircling garments around the legs, pelvis, or waist.  Walk as much as possible to increase blood flow.  Raise the foot of your bed at night with 2-inch blocks.  If you get a cut in the skin over the vein and the vein bleeds, lie down with your leg raised and press on it with a clean cloth until the bleeding stops. Then place a bandage (dressing) on the cut. See your caregiver if it continues to bleed or needs stitches. SEEK MEDICAL CARE IF:   The skin around your ankle starts to break down.  You have pain, redness, tenderness, or hard swelling developing in your leg over a vein.  You are uncomfortable due to leg pain. Document Released: 03/01/2005 Document Revised: 08/14/2011 Document Reviewed: 07/18/2010 [facility] Patient Information 2014 [place], [place].

## 2013-05-14 NOTE — Progress Notes (Signed)
Pre-visit discussion using our clinic review tool. No additional management support is needed unless otherwise documented below in the visit note.  

## 2013-05-26 ENCOUNTER — Ambulatory Visit
Admission: RE | Admit: 2013-05-26 | Discharge: 2013-05-26 | Disposition: A | Discharge status: Home / Self Care | Payer: Managed Care, Other (non HMO) | Source: Ambulatory Visit | Attending: Internal Medicine | Admitting: Internal Medicine

## 2013-05-26 DIAGNOSIS — R233 Spontaneous ecchymoses: Secondary | ICD-10-CM

## 2013-05-26 DIAGNOSIS — I8390 Asymptomatic varicose veins of unspecified lower extremity: Secondary | ICD-10-CM

## 2013-06-10 ENCOUNTER — Other Ambulatory Visit: Payer: Self-pay | Admitting: Internal Medicine

## 2013-07-12 ENCOUNTER — Other Ambulatory Visit: Payer: Self-pay | Admitting: Internal Medicine

## 2013-09-11 ENCOUNTER — Other Ambulatory Visit: Payer: Self-pay | Admitting: Internal Medicine

## 2015-03-31 ENCOUNTER — Telehealth: Payer: Self-pay | Admitting: Internal Medicine

## 2015-03-31 NOTE — Telephone Encounter (Signed)
Pt request to transfer from Dr. Felicity Coyerleschber to Dr. Jonny RuizJohn. Please advise, pt has Rosann AuerbachCigna and her spouse is [name] 12/25/1960.

## 2015-03-31 NOTE — Telephone Encounter (Signed)
Ok with me 

## 2015-04-01 NOTE — Telephone Encounter (Signed)
Go ahead and schedule an est appt with Dr. Jonny RuizJohn please.

## 2015-04-01 NOTE — Telephone Encounter (Signed)
Got scheduled  °

## 2015-04-06 ENCOUNTER — Encounter: Payer: Self-pay | Admitting: Internal Medicine

## 2015-04-06 ENCOUNTER — Ambulatory Visit (INDEPENDENT_AMBULATORY_CARE_PROVIDER_SITE_OTHER): Payer: Managed Care, Other (non HMO) | Admitting: Internal Medicine

## 2015-04-06 ENCOUNTER — Other Ambulatory Visit (INDEPENDENT_AMBULATORY_CARE_PROVIDER_SITE_OTHER): Payer: Managed Care, Other (non HMO)

## 2015-04-06 VITALS — BP 106/66 | HR 70 | Temp 98.1°F | Ht 66.0 in | Wt 140.0 lb

## 2015-04-06 DIAGNOSIS — E785 Hyperlipidemia, unspecified: Secondary | ICD-10-CM

## 2015-04-06 DIAGNOSIS — Z0189 Encounter for other specified special examinations: Secondary | ICD-10-CM | POA: Diagnosis not present

## 2015-04-06 DIAGNOSIS — Z0001 Encounter for general adult medical examination with abnormal findings: Secondary | ICD-10-CM | POA: Insufficient documentation

## 2015-04-06 DIAGNOSIS — Z0000 Encounter for general adult medical examination without abnormal findings: Secondary | ICD-10-CM | POA: Diagnosis not present

## 2015-04-06 DIAGNOSIS — E538 Deficiency of other specified B group vitamins: Secondary | ICD-10-CM

## 2015-04-06 LAB — CBC WITH DIFFERENTIAL/PLATELET
BASOS ABS: 0 10*3/uL (ref 0.0–0.1)
Basophils Relative: 0.4 % (ref 0.0–3.0)
EOS PCT: 1.8 % (ref 0.0–5.0)
Eosinophils Absolute: 0.1 10*3/uL (ref 0.0–0.7)
HEMATOCRIT: 41.5 % (ref 36.0–46.0)
Hemoglobin: 14 g/dL (ref 12.0–15.0)
LYMPHS ABS: 2.2 10*3/uL (ref 0.7–4.0)
Lymphocytes Relative: 31.2 % (ref 12.0–46.0)
MCHC: 33.7 g/dL (ref 30.0–36.0)
MCV: 91.4 fl (ref 78.0–100.0)
MONOS PCT: 7 % (ref 3.0–12.0)
Monocytes Absolute: 0.5 10*3/uL (ref 0.1–1.0)
NEUTROS PCT: 59.6 % (ref 43.0–77.0)
Neutro Abs: 4.3 10*3/uL (ref 1.4–7.7)
Platelets: 352 10*3/uL (ref 150.0–400.0)
RBC: 4.54 Mil/uL (ref 3.87–5.11)
RDW: 12.9 % (ref 11.5–15.5)
WBC: 7.2 10*3/uL (ref 4.0–10.5)

## 2015-04-06 LAB — URINALYSIS, ROUTINE W REFLEX MICROSCOPIC
Bilirubin Urine: NEGATIVE
Hgb urine dipstick: NEGATIVE
Ketones, ur: 15 — AB
Leukocytes, UA: NEGATIVE
Nitrite: NEGATIVE
PH: 6 (ref 5.0–8.0)
SPECIFIC GRAVITY, URINE: 1.01 (ref 1.000–1.030)
Total Protein, Urine: NEGATIVE
UROBILINOGEN UA: 0.2 (ref 0.0–1.0)
Urine Glucose: NEGATIVE

## 2015-04-06 LAB — HEPATIC FUNCTION PANEL
ALK PHOS: 75 U/L (ref 39–117)
ALT: 14 U/L (ref 0–35)
AST: 19 U/L (ref 0–37)
Albumin: 4.4 g/dL (ref 3.5–5.2)
BILIRUBIN DIRECT: 0.1 mg/dL (ref 0.0–0.3)
BILIRUBIN TOTAL: 0.6 mg/dL (ref 0.2–1.2)
TOTAL PROTEIN: 7.4 g/dL (ref 6.0–8.3)

## 2015-04-06 LAB — BASIC METABOLIC PANEL
BUN: 8 mg/dL (ref 6–23)
CHLORIDE: 101 meq/L (ref 96–112)
CO2: 29 meq/L (ref 19–32)
Calcium: 10.3 mg/dL (ref 8.4–10.5)
Creatinine, Ser: 0.79 mg/dL (ref 0.40–1.20)
GFR: 80.17 mL/min (ref >60.00)
Glucose, Bld: 88 mg/dL (ref 70–99)
POTASSIUM: 4.5 meq/L (ref 3.5–5.1)
Sodium: 138 mEq/L (ref 135–145)

## 2015-04-06 LAB — LIPID PANEL
CHOL/HDL RATIO: 3
Cholesterol: 205 mg/dL — ABNORMAL HIGH (ref 0–200)
HDL: 72 mg/dL (ref >39.00)
LDL Cholesterol: 114 mg/dL — ABNORMAL HIGH (ref 0–99)
NONHDL: 133.37
Triglycerides: 99 mg/dL (ref 0.0–149.0)
VLDL: 19.8 mg/dL (ref 0.0–40.0)

## 2015-04-06 LAB — TSH: TSH: 1.08 u[IU]/mL (ref 0.35–4.50)

## 2015-04-06 NOTE — Assessment & Plan Note (Signed)

## 2015-04-06 NOTE — Patient Instructions (Signed)

## 2015-04-06 NOTE — Progress Notes (Signed)
Subjective:    Patient ID: Mandy Watkins, female    DOB: 1960/08/10, 54 y.o.   MRN: [id number]  HPI  Here for wellness and f/u;  Overall doing ok;  Pt denies Chest pain, worsening SOB, DOE, wheezing, orthopnea, PND, worsening LE edema, palpitations, dizziness or syncope.  Pt denies neurological change such as new headache, facial or extremity weakness.  Pt denies polydipsia, polyuria, or low sugar symptoms. Pt states overall good compliance with treatment and medications, good tolerability, and has been trying to follow appropriate diet.  Pt denies worsening depressive symptoms, suicidal ideation or panic. No fever, night sweats, wt loss, loss of appetite, or other constitutional symptoms.  Pt states good ability with ADL's, has low fall risk, home safety reviewed and adequate, no other significant changes in hearing or vision, and only occasionally active with exercise.  Last B12 about 2 yr.  + menopasal per lab work 2 wks ago, taking otc supplements  Has seen Dr Marva Panda.  Declines flu shot.   Past Medical History  Diagnosis Date  . VITAMIN B12 DEFICIENCY dx 06/26/2010  . ALLERGIC RHINITIS   . CARPAL TUNNEL SYNDROME, RIGHT   . SEIZURE DISORDER     as child  . Varicose vein of leg     prior ablation  . Hyperlipidemia    Past Surgical History  Procedure Laterality Date  . Varicose vein surgery      sclerosis tx    reports that she has never smoked. She does not have any smokeless tobacco history on file. She reports that she drinks alcohol. She reports that she does not use illicit drugs. family history includes Diabetes in her father and mother; Hypertension in her father and mother. There is no history of Colon cancer. Allergies  Allergen Reactions  . Sulfonamide Derivatives Hives  . Amoxicillin-Pot Clavulanate Rash  . Cephalexin Rash   Current Outpatient Prescriptions on File Prior to Visit  Medication Sig Dispense Refill  . aspirin 81 MG tablet Take 81 mg by mouth daily.       . Cholecalciferol (VITAMIN D) 2000 UNITS CAPS Take 2,000 Units by mouth daily.      . calcium carbonate (OS-CAL) 600 MG TABS Take 600 mg by mouth daily.      . cyanocobalamin (,VITAMIN B-12,) 1000 MCG/ML injection INJECT 1 ML (1,000 MCG TOTAL) INTO THE MUSCLE EVERY 30 (THIRTY) DAYS. (Patient not taking: Reported on 04/06/2015) 1 mL 0  . cyanocobalamin (,VITAMIN B-12,) 1000 MCG/ML injection INJECT 1 ML (1,000 MCG TOTAL) INTO THE MUSCLE EVERY 30 (THIRTY) DAYS. (Patient not taking: Reported on 04/06/2015) 1 mL 0  . cyanocobalamin (,VITAMIN B-12,) 1000 MCG/ML injection INJECT 1 ML (1,000 MCG TOTAL) INTO THE MUSCLE EVERY 30 (THIRTY) DAYS. (Patient not taking: Reported on 04/06/2015) 1 mL 0  . fexofenadine (ALLEGRA) 180 MG tablet Take 180 mg by mouth daily.      . Insulin Syringe-Needle U-100 (B-D INSULIN SYRINGE 1CC/25GX1") 25G X 1" 1 ML MISC 1 each by Does not apply route every 30 (thirty) days. (Patient not taking: Reported on 04/06/2015) 2 each 5  . LO LOESTRIN FE 1 MG-10 MCG / 10 MCG tablet TAKE 1 BY MOUTH DAILY    . MINASTRIN 24 FE 1-20 MG-MCG(24) CHEW Chew 1 tablet by mouth daily.     . Omega-3 Fatty Acids (FISH OIL) 1000 MG CAPS Take 1,000 mg by mouth daily.       No current facility-administered medications on file prior to visit.  Review of Systems Constitutional: Negative for increased diaphoresis, other activity, appetite or siginficant weight change other than noted HENT: Negative for worsening hearing loss, ear pain, facial swelling, mouth sores and neck stiffness.   Eyes: Negative for other worsening pain, redness or visual disturbance.  Respiratory: Negative for shortness of breath and wheezing  Cardiovascular: Negative for chest pain and palpitations.  Gastrointestinal: Negative for diarrhea, blood in stool, abdominal distention or other pain Genitourinary: Negative for hematuria, flank pain or change in urine volume.  Musculoskeletal: Negative for myalgias or other joint  complaints.  Skin: Negative for color change and wound or drainage.  Neurological: Negative for syncope and numbness. other than noted Hematological: Negative for adenopathy. or other swelling Psychiatric/Behavioral: Negative for hallucinations, SI, self-injury, decreased concentration or other worsening agitation.      Objective:   Physical Exam BP 106/66 mmHg  Pulse 70  Temp(Src) 98.1 F (36.7 C) (Oral)  Ht 5\' 6"  (1.676 m)  Wt 140 lb (63.504 kg)  BMI 22.61 kg/m2  SpO2 98% VS noted,  Constitutional: Pt is oriented to person, place, and time. Appears well-developed and well-nourished, in no significant distress Head: Normocephalic and atraumatic.  Right Ear: External ear normal.  Left Ear: External ear normal.  Nose: Nose normal.  Mouth/Throat: Oropharynx is clear and moist.  Eyes: Conjunctivae and EOM are normal. Pupils are equal, round, and reactive to light.  Neck: Normal range of motion. Neck supple. No JVD present. No tracheal deviation present or significant neck LA or mass Cardiovascular: Normal rate, regular rhythm, normal heart sounds and intact distal pulses.   Pulmonary/Chest: Effort normal and breath sounds without rales or wheezing  Abdominal: Soft. Bowel sounds are normal. NT. No HSM  Musculoskeletal: Normal range of motion. Exhibits no edema.  Lymphadenopathy:  Has no cervical adenopathy.  Neurological: Pt is alert and oriented to person, place, and time. Pt has normal reflexes. No cranial nerve deficit. Motor grossly intact Skin: Skin is warm and dry. No rash noted.  Psychiatric:  Has normal mood and affect. Behavior is normal.     Assessment & Plan:

## 2015-04-06 NOTE — Addendum Note (Signed)
Addended by: Corwin LevinsJOHN, Cheryle Dark W on: 04/06/2015 04:49 PM   Modules accepted: Orders

## 2015-04-06 NOTE — Progress Notes (Signed)
Pre visit review using our clinic review tool, if applicable. No additional management support is needed unless otherwise documented below in the visit note. 

## 2015-04-06 NOTE — Assessment & Plan Note (Signed)
For lower chol diet 

## 2015-04-06 NOTE — Assessment & Plan Note (Signed)
For b12 f/u

## 2015-04-07 ENCOUNTER — Encounter: Payer: Self-pay | Admitting: Internal Medicine

## 2015-04-07 LAB — HEPATITIS C ANTIBODY: HCV AB: NEGATIVE

## 2016-09-06 ENCOUNTER — Ambulatory Visit (INDEPENDENT_AMBULATORY_CARE_PROVIDER_SITE_OTHER): Payer: Managed Care, Other (non HMO) | Admitting: Internal Medicine

## 2016-09-06 ENCOUNTER — Encounter: Payer: Self-pay | Admitting: Internal Medicine

## 2016-09-06 VITALS — BP 110/74 | HR 87 | Temp 100.6°F | Ht 66.0 in | Wt 139.0 lb

## 2016-09-06 DIAGNOSIS — R05 Cough: Secondary | ICD-10-CM | POA: Diagnosis not present

## 2016-09-06 DIAGNOSIS — R21 Rash and other nonspecific skin eruption: Secondary | ICD-10-CM | POA: Insufficient documentation

## 2016-09-06 DIAGNOSIS — R059 Cough, unspecified: Secondary | ICD-10-CM

## 2016-09-06 DIAGNOSIS — J309 Allergic rhinitis, unspecified: Secondary | ICD-10-CM | POA: Diagnosis not present

## 2016-09-06 MED ORDER — AZITHROMYCIN 250 MG PO TABS: ORAL_TABLET | ORAL | 1 refills | Status: DC

## 2016-09-06 MED ORDER — TRIAMCINOLONE ACETONIDE 0.1 % EX CREA: 1.0000 "application " | TOPICAL_CREAM | Freq: Two times a day (BID) | CUTANEOUS | 0 refills | Status: AC

## 2016-09-06 MED ORDER — HYDROCODONE-HOMATROPINE 5-1.5 MG/5ML PO SYRP: 5.0000 mL | ORAL_SOLUTION | Freq: Four times a day (QID) | ORAL | 0 refills | Status: AC | PRN

## 2016-09-06 NOTE — Assessment & Plan Note (Signed)
Ok to continue otc antihistamine,  to f/u any worsening symptoms or concerns

## 2016-09-06 NOTE — Patient Instructions (Signed)
Please take all new medication as prescribed - the antibiotic, and cough medicine, as well as the steroid cream as needed  Please continue all other medications as before, and refills have been done if requested.  Please have the pharmacy call with any other refills you may need.  Please keep your appointments with your specialists as you may have planned

## 2016-09-06 NOTE — Assessment & Plan Note (Signed)
Mild to mod, c/w bronchitis vs pna, declines cxr, for antibx course, cough med prn,  to f/u any worsening symptoms or concerns 

## 2016-09-06 NOTE — Progress Notes (Signed)
Subjective:    Patient ID: Mandy Watkins, female    DOB: Aug 26, 1960, 56 y.o.   MRN: [id number]  HPI Here with acute onset mild to mod 2-3 days ST, HA, general weakness and malaise, with prod cough greenish sputum, but Pt denies chest pain, increased sob or doe, wheezing, orthopnea, PND, increased LE swelling, palpitations, dizziness or syncope.  Does also have an unusual ? 1 mo irritated appearing area over the left achilles tendon area.  Pt denies new neurological symptoms such as new headache, or facial or extremity weakness or numbness   Pt denies polydipsia, polyuria. Prior to illness had farily well controlled allergy symptoms but did restart her otc antihistamine yesterday Past Medical History:  Diagnosis Date  . ALLERGIC RHINITIS   . CARPAL TUNNEL SYNDROME, RIGHT   . Hyperlipidemia   . SEIZURE DISORDER    as child  . Varicose vein of leg    prior ablation  . VITAMIN B12 DEFICIENCY dx 06/26/2010   Past Surgical History:  Procedure Laterality Date  . VARICOSE VEIN SURGERY     sclerosis tx    reports that she has never smoked. She has never used smokeless tobacco. She reports that she drinks alcohol. She reports that she does not use drugs. family history includes Diabetes in her father and mother; Hypertension in her father and mother. Allergies  Allergen Reactions  . Sulfonamide Derivatives Hives  . Amoxicillin-Pot Clavulanate Rash  . Cephalexin Rash   Current Outpatient Prescriptions on File Prior to Visit  Medication Sig Dispense Refill  . aspirin 81 MG tablet Take 81 mg by mouth daily.      . calcium carbonate (OS-CAL) 600 MG TABS Take 600 mg by mouth daily.      . Omega-3 Fatty Acids (FISH OIL) 1000 MG CAPS Take 1,000 mg by mouth daily.       No current facility-administered medications on file prior to visit.    Review of Systems   Constitutional: Negative for other unusual diaphoresis or sweats HENT: Negative for ear discharge or swelling Eyes: Negative for  other worsening visual disturbances Respiratory: Negative for stridor or other swelling  Gastrointestinal: Negative for worsening distension or other blood Genitourinary: Negative for retention or other urinary change Musculoskeletal: Negative for other MSK pain or swelling Skin: Negative for color change or other new lesions Neurological: Negative for worsening tremors and other numbness  Psychiatric/Behavioral: Negative for worsening agitation or other fatigue All otherwise neg per pt    Objective:   Physical Exam BP 110/74   Pulse 87   Temp (!) 100.6 F (38.1 C) (Oral)   Ht  (1.676 m)   Wt 139 lb (63 kg)   SpO2 97%   BMI 22.44 kg/m  VS noted, mild ill appearing Constitutional: Pt appears in NAD HENT: Head: NCAT.  Right Ear: External ear normal.  Left Ear: External ear normal.  Bilat tm's with mild erythema.  Max sinus areas non tender.  Pharynx with mild erythema, no exudateEyes: . Pupils are equal, round, and reactive to light. Conjunctivae and EOM are normal Nose: without d/c or deformity Neck: Neck supple. Gross normal ROM Cardiovascular: Normal rate and regular rhythm.   Pulmonary/Chest: Effort normal and breath sounds without rales or insp wheezing but several right rhonchi noted and intermittent exp wheeze  Neurological: Pt is alert. At baseline orientation, motor grossly intact Skin: Skin is warm., no LE edema except does have left post achilles tendon area 2x3 cm area erythema nontender,  no swelling ulcer or other abnormality, Psychiatric: Pt behavior is normal without agitation  No other exam findings     Assessment & Plan:

## 2016-09-06 NOTE — Assessment & Plan Note (Signed)
c/w mild dermatitis, for triam cr prn,  to f/u any worsening symptoms or concerns  

## 2016-09-06 NOTE — Progress Notes (Signed)
Pre visit review using our clinic review tool, if applicable. No additional management support is needed unless otherwise documented below in the visit note. 

## 2019-09-30 ENCOUNTER — Ambulatory Visit (INDEPENDENT_AMBULATORY_CARE_PROVIDER_SITE_OTHER): Admitting: Internal Medicine

## 2019-09-30 ENCOUNTER — Other Ambulatory Visit: Payer: Self-pay

## 2019-09-30 ENCOUNTER — Encounter: Payer: Self-pay | Admitting: Internal Medicine

## 2019-09-30 VITALS — BP 120/72 | HR 68 | Temp 98.3°F | Ht 66.0 in | Wt 153.0 lb

## 2019-09-30 DIAGNOSIS — Z114 Encounter for screening for human immunodeficiency virus [HIV]: Secondary | ICD-10-CM | POA: Diagnosis not present

## 2019-09-30 DIAGNOSIS — E559 Vitamin D deficiency, unspecified: Secondary | ICD-10-CM

## 2019-09-30 DIAGNOSIS — E538 Deficiency of other specified B group vitamins: Secondary | ICD-10-CM

## 2019-09-30 DIAGNOSIS — Z0000 Encounter for general adult medical examination without abnormal findings: Secondary | ICD-10-CM | POA: Diagnosis not present

## 2019-09-30 LAB — BASIC METABOLIC PANEL
BUN: 13 mg/dL (ref 6–23)
CO2: 30 mEq/L (ref 19–32)
Calcium: 10.1 mg/dL (ref 8.4–10.5)
Chloride: 100 mEq/L (ref 96–112)
Creatinine, Ser: 0.73 mg/dL (ref 0.40–1.20)
GFR: 81.34 mL/min (ref >60.00)
Glucose, Bld: 88 mg/dL (ref 70–99)
Potassium: 4.4 mEq/L (ref 3.5–5.1)
Sodium: 136 mEq/L (ref 135–145)

## 2019-09-30 LAB — CBC WITH DIFFERENTIAL/PLATELET
Basophils Absolute: 0 10*3/uL (ref 0.0–0.1)
Basophils Relative: 0.5 % (ref 0.0–3.0)
Eosinophils Absolute: 0.1 10*3/uL (ref 0.0–0.7)
Eosinophils Relative: 2 % (ref 0.0–5.0)
HCT: 41.8 % (ref 36.0–46.0)
Hemoglobin: 14 g/dL (ref 12.0–15.0)
Lymphocytes Relative: 26.1 % (ref 12.0–46.0)
Lymphs Abs: 1.6 10*3/uL (ref 0.7–4.0)
MCHC: 33.4 g/dL (ref 30.0–36.0)
MCV: 93.7 fl (ref 78.0–100.0)
Monocytes Absolute: 0.5 10*3/uL (ref 0.1–1.0)
Monocytes Relative: 8 % (ref 3.0–12.0)
Neutro Abs: 3.9 10*3/uL (ref 1.4–7.7)
Neutrophils Relative %: 63.4 % (ref 43.0–77.0)
Platelets: 333 10*3/uL (ref 150.0–400.0)
RBC: 4.46 Mil/uL (ref 3.87–5.11)
RDW: 12.7 % (ref 11.5–15.5)
WBC: 6.1 10*3/uL (ref 4.0–10.5)

## 2019-09-30 LAB — URINALYSIS, ROUTINE W REFLEX MICROSCOPIC
Bilirubin Urine: NEGATIVE
Hgb urine dipstick: NEGATIVE
Ketones, ur: NEGATIVE
Leukocytes,Ua: NEGATIVE
Nitrite: NEGATIVE
RBC / HPF: NONE SEEN (ref 0–?)
Specific Gravity, Urine: <=1.005 — AB (ref 1.000–1.030)
Total Protein, Urine: NEGATIVE
Urine Glucose: NEGATIVE
Urobilinogen, UA: 0.2 (ref 0.0–1.0)
WBC, UA: NONE SEEN (ref 0–?)
pH: 6.5 (ref 5.0–8.0)

## 2019-09-30 LAB — VITAMIN D 25 HYDROXY (VIT D DEFICIENCY, FRACTURES): VITD: 52.15 ng/mL (ref 30.00–100.00)

## 2019-09-30 LAB — VITAMIN B12: Vitamin B-12: 498 pg/mL (ref 211–911)

## 2019-09-30 LAB — TSH: TSH: 1.34 u[IU]/mL (ref 0.35–4.50)

## 2019-09-30 LAB — LIPID PANEL
Cholesterol: 239 mg/dL — ABNORMAL HIGH (ref 0–200)
HDL: 71 mg/dL (ref >39.00)
LDL Cholesterol: 152 mg/dL — ABNORMAL HIGH (ref 0–99)
NonHDL: 168.28
Total CHOL/HDL Ratio: 3
Triglycerides: 83 mg/dL (ref 0.0–149.0)
VLDL: 16.6 mg/dL (ref 0.0–40.0)

## 2019-09-30 LAB — HEPATIC FUNCTION PANEL
ALT: 15 U/L (ref 0–35)
AST: 21 U/L (ref 0–37)
Albumin: 4.6 g/dL (ref 3.5–5.2)
Alkaline Phosphatase: 79 U/L (ref 39–117)
Bilirubin, Direct: 0.1 mg/dL (ref 0.0–0.3)
Total Bilirubin: 0.7 mg/dL (ref 0.2–1.2)
Total Protein: 7.4 g/dL (ref 6.0–8.3)

## 2019-09-30 NOTE — Patient Instructions (Signed)
Please continue all other medications as before, and refills have been done if requested.  Please have the pharmacy call with any other refills you may need.  Please continue your efforts at being more active, low cholesterol diet, and weight control.  You are otherwise up to date with prevention measures today.  Please keep your appointments with your specialists as you may have planned  Please call if you change your mind about the Cardiac CT score test  Please go to the LAB at the blood drawing area for the tests to be done  You will be contacted by phone if any changes need to be made immediately.  Otherwise, you will receive a letter about your results with an explanation, but please check with MyChart first.  Please remember to sign up for MyChart if you have not done so, as this will be important to you in the future with finding out test results, communicating by private email, and scheduling acute appointments online when needed.  Please make an Appointment to return for your 1 year visit, or sooner if needed

## 2019-09-30 NOTE — Assessment & Plan Note (Signed)

## 2019-09-30 NOTE — Progress Notes (Signed)
Subjective:    Patient ID: Mandy Watkins, female    DOB: 1960/06/08, 59 y.o.   MRN: [id number]  HPI  Here for wellness and f/u;  Overall doing ok;  Pt denies Chest pain, worsening SOB, DOE, wheezing, orthopnea, PND, worsening LE edema, palpitations, dizziness or syncope.  Pt denies neurological change such as new headache, facial or extremity weakness.  Pt denies polydipsia, polyuria, or low sugar symptoms. Pt states overall good compliance with treatment and medications, good tolerability, and has been trying to follow appropriate diet.  Pt denies worsening depressive symptoms, suicidal ideation or panic. No fever, night sweats, wt loss, loss of appetite, or other constitutional symptoms.  Pt states good ability with ADL's, has low fall risk, home safety reviewed and adequate, no other significant changes in hearing or vision, and only occasionally active with exercise. Gained some wt with pandemic but starting to be more active Wt Readings from Last 3 Encounters:  09/30/19 153 lb (69.4 kg)  09/06/16 139 lb (63 kg)  04/06/15 140 lb (63.5 kg)   Past Medical History:  Diagnosis Date  . ALLERGIC RHINITIS   . CARPAL TUNNEL SYNDROME, RIGHT   . Hyperlipidemia   . SEIZURE DISORDER    as child  . Varicose vein of leg    prior ablation  . VITAMIN B12 DEFICIENCY dx 06/26/2010   Past Surgical History:  Procedure Laterality Date  . VARICOSE VEIN SURGERY     sclerosis tx    reports that she has never smoked. She has never used smokeless tobacco. She reports current alcohol use. She reports that she does not use drugs. family history includes Diabetes in her father and mother; Hypertension in her father and mother. Allergies  Allergen Reactions  . Sulfonamide Derivatives Hives  . Amoxicillin-Pot Clavulanate Rash  . Cephalexin Rash   Current Outpatient Medications on File Prior to Visit  Medication Sig Dispense Refill  . aspirin 81 MG tablet Take 81 mg by mouth daily.      .  Cholecalciferol 25 MCG (1000 UT) capsule Take by mouth.    . Multiple Vitamins-Minerals (THERA-M) TABS Take by mouth.    . Omega-3 Fatty Acids (FISH OIL) 1000 MG CAPS Take 1,000 mg by mouth daily.       No current facility-administered medications on file prior to visit.   Review of Systems All otherwise neg per pt     Objective:   Physical Exam BP 120/72 (BP Location: Left Arm, Patient Position: Sitting, Cuff Size: Large)   Pulse 68   Temp 98.3 F (36.8 C) (Oral)   Ht 5\' 6"  (1.676 m)   Wt 153 lb (69.4 kg)   SpO2 99%   BMI 24.69 kg/m  VS noted,  Constitutional: Pt appears in NAD HENT: Head: NCAT.  Right Ear: External ear normal.  Left Ear: External ear normal.  Eyes: . Pupils are equal, round, and reactive to light. Conjunctivae and EOM are normal Nose: without d/c or deformity Neck: Neck supple. Gross normal ROM Cardiovascular: Normal rate and regular rhythm.   Pulmonary/Chest: Effort normal and breath sounds without rales or wheezing.  Abd:  Soft, NT, ND, + BS, no organomegaly Neurological: Pt is alert. At baseline orientation, motor grossly intact Skin: Skin is warm. No rashes, other new lesions, no LE edema Psychiatric: Pt behavior is normal without agitation  All otherwise neg per pt Lab Results  Component Value Date   WBC 6.1 09/30/2019   HGB 14.0 09/30/2019   HCT 41.8  09/30/2019   PLT 333.0 09/30/2019   GLUCOSE 88 09/30/2019   CHOL 239 (H) 09/30/2019   TRIG 83.0 09/30/2019   HDL 71.00 09/30/2019   LDLDIRECT 116.9 03/27/2011   LDLCALC 152 (H) 09/30/2019   ALT 15 09/30/2019   AST 21 09/30/2019   NA 136 09/30/2019   K 4.4 09/30/2019   CL 100 09/30/2019   CREATININE 0.73 09/30/2019   BUN 13 09/30/2019   CO2 30 09/30/2019   TSH 1.34 09/30/2019       Assessment & Plan:

## 2019-10-01 LAB — HIV ANTIBODY (ROUTINE TESTING W REFLEX): HIV 1&2 Ab, 4th Generation: NONREACTIVE

## 2020-09-30 ENCOUNTER — Encounter: Admitting: Internal Medicine

## 2020-11-03 ENCOUNTER — Encounter: Admitting: Internal Medicine

## 2020-12-16 ENCOUNTER — Other Ambulatory Visit: Payer: Self-pay

## 2020-12-16 ENCOUNTER — Ambulatory Visit (INDEPENDENT_AMBULATORY_CARE_PROVIDER_SITE_OTHER): Admitting: Internal Medicine

## 2020-12-16 ENCOUNTER — Ambulatory Visit (INDEPENDENT_AMBULATORY_CARE_PROVIDER_SITE_OTHER)

## 2020-12-16 ENCOUNTER — Encounter: Payer: Self-pay | Admitting: Internal Medicine

## 2020-12-16 VITALS — BP 112/70 | HR 75 | Temp 99.0°F | Ht 66.0 in | Wt 147.0 lb

## 2020-12-16 DIAGNOSIS — E7800 Pure hypercholesterolemia, unspecified: Secondary | ICD-10-CM | POA: Diagnosis not present

## 2020-12-16 DIAGNOSIS — R0600 Dyspnea, unspecified: Secondary | ICD-10-CM | POA: Diagnosis not present

## 2020-12-16 DIAGNOSIS — Z0000 Encounter for general adult medical examination without abnormal findings: Secondary | ICD-10-CM | POA: Diagnosis not present

## 2020-12-16 NOTE — Progress Notes (Signed)
Patient ID: Mandy Watkins, female   DOB: July 31, 1960, 60 y.o.   MRN: [id number]         Chief Complaint:: wellness exam and dyspnea/catch breaths and hld       HPI:  Mandy Watkins is a 60 y.o. female here for wellness exam; declines covid booster, o/w up to date with preventive referral and immunizations.                        Also has occasional catch in breathing, but no fever and Pt denies chest pain, wheezing, orthopnea, PND, increased LE swelling, palpitations, dizziness or syncope.  Trying to follow lower chol diet but feels she can do better.   Pt denies polydipsia, polyuria, or new focal neuro s/s.     Wt Readings from Last 3 Encounters:  12/16/20 147 lb (66.7 kg)  09/30/19 153 lb (69.4 kg)  09/06/16 139 lb (63 kg)   BP Readings from Last 3 Encounters:  12/16/20 112/70  09/30/19 120/72  09/06/16 110/74   Immunization History  Administered Date(s) Administered   Influenza Split 03/27/2011, 04/03/2012   Influenza-Unspecified 03/26/2017   PFIZER(Purple Top)SARS-COV-2 Vaccination 08/09/2019, 09/12/2019, 05/05/2020   PPD Test 10/24/2010   Tdap 03/27/2011, 10/30/2015   Zoster Recombinat (Shingrix) 03/05/2018, 05/06/2018   There are no preventive care reminders to display for this patient.     Past Medical History:  Diagnosis Date   ALLERGIC RHINITIS    CARPAL TUNNEL SYNDROME, RIGHT    Hyperlipidemia    SEIZURE DISORDER    as child   Varicose vein of leg    prior ablation   VITAMIN B12 DEFICIENCY dx 06/26/2010   Past Surgical History:  Procedure Laterality Date   VARICOSE VEIN SURGERY     sclerosis tx    reports that she has never smoked. She has never used smokeless tobacco. She reports current alcohol use. She reports that she does not use drugs. family history includes Diabetes in her father and mother; Hypertension in her father and mother. Allergies  Allergen Reactions   Cephalexin Rash and Hives   Sulfa Antibiotics Hives   Sulfonamide Derivatives  Hives   Amoxicillin-Pot Clavulanate Rash   Current Outpatient Medications on File Prior to Visit  Medication Sig Dispense Refill   aspirin 81 MG tablet Take 81 mg by mouth daily.       Cholecalciferol 25 MCG (1000 UT) capsule Take by mouth.     Multiple Vitamins-Minerals (THERA-M) TABS Take by mouth.     Omega-3 Fatty Acids (FISH OIL) 1000 MG CAPS Take 1,000 mg by mouth daily.       No current facility-administered medications on file prior to visit.        ROS:  All others reviewed and negative.  Objective        PE:  BP 112/70 (BP Location: Left Arm, Patient Position: Sitting, Cuff Size: Normal)   Pulse 75   Temp 99 F (37.2 C) (Oral)   Ht 5\' 6"  (1.676 m)   Wt 147 lb (66.7 kg)   SpO2 99%   BMI 23.73 kg/m                 Constitutional: Pt appears in NAD               HENT: Head: NCAT.                Right Ear: External ear normal.  Left Ear: External ear normal.                Eyes: . Pupils are equal, round, and reactive to light. Conjunctivae and EOM are normal               Nose: without d/c or deformity               Neck: Neck supple. Gross normal ROM               Cardiovascular: Normal rate and regular rhythm.                 Pulmonary/Chest: Effort normal and breath sounds without rales or wheezing.                Abd:  Soft, NT, ND, + BS, no organomegaly               Neurological: Pt is alert. At baseline orientation, motor grossly intact               Skin: Skin is warm. No rashes, no other new lesions, LE edema - none               Psychiatric: Pt behavior is normal without agitation   Micro: none  Cardiac tracings I have personally interpreted today:  none  Pertinent Radiological findings (summarize): none   Lab Results  Component Value Date   WBC 5.2 12/17/2020   HGB 13.8 12/17/2020   HCT 39.8 12/17/2020   PLT 332.0 12/17/2020   GLUCOSE 93 12/17/2020   CHOL 234 (H) 12/17/2020   TRIG 72.0 12/17/2020   HDL 77.70 12/17/2020    LDLDIRECT 116.9 03/27/2011   LDLCALC 142 (H) 12/17/2020   ALT 12 12/17/2020   AST 16 12/17/2020   NA 136 12/17/2020   K 4.2 12/17/2020   CL 101 12/17/2020   CREATININE 0.77 12/17/2020   BUN 11 12/17/2020   CO2 28 12/17/2020   TSH 2.10 12/17/2020   HGBA1C 5.3 12/17/2020   Assessment/Plan:  Mandy Watkins is a 60 y.o. White or Caucasian [1] female with  has a past medical history of ALLERGIC RHINITIS, CARPAL TUNNEL SYNDROME, RIGHT, Hyperlipidemia, SEIZURE DISORDER, Varicose vein of leg, and VITAMIN B12 DEFICIENCY (dx 06/26/2010).  Preventative health care Age and sex appropriate education and counseling updated with regular exercise and diet Referrals for preventative services - none needed Immunizations addressed - declines covid booster Smoking counseling  - none needed Evidence for depression or other mood disorder - none significant Most recent labs reviewed. I have personally reviewed and have noted: 1) the patient's medical and social history 2) The patient's current medications and supplements 3) The patient's height, weight, and BMI have been recorded in the chart   Hyperlipidemia Lab Results  Component Value Date   LDLCALC 142 (H) 12/17/2020   Stable, pt to continue current low chol diet, declines statin for now, consider cardiac CT score   Dyspnea Very mild but unsuual, exam benign, for cxr  Followup: Return in about 1 year (around 12/16/2021).  Oliver Barre, MD 12/19/2020 9:17 PM [facility] - [facility]

## 2020-12-16 NOTE — Patient Instructions (Addendum)
Please consider the second covid booster OR consider the Novavax covid vaccine which is coming soon.  We have discussed the Cardiac CT Score test to measure the calcification level (if any) in your heart arteries.  This test has been ordered in our Computer System, so please call [facility] directly, as they prefer this, at [phone] to be scheduled.  Please continue all other medications as before, and refills have been done if requested.  Please have the pharmacy call with any other refills you may need.  Please continue your efforts at being more active, low cholesterol diet, and weight control.  You are otherwise up to date with prevention measures today.  Please keep your appointments with your specialists as you may have planned  Please go to the XRAY Department in the first floor for the x-ray testing  Please go to the LAB at the blood drawing area for the tests to be done - at the [facility] at your convenience  You will be contacted by phone if any changes need to be made immediately.  Otherwise, you will receive a letter about your results with an explanation, but please check with MyChart first.  Please remember to sign up for MyChart if you have not done so, as this will be important to you in the future with finding out test results, communicating by private email, and scheduling acute appointments online when needed.  Please make an Appointment to return for your 1 year visit, or sooner if needed

## 2020-12-17 ENCOUNTER — Other Ambulatory Visit (INDEPENDENT_AMBULATORY_CARE_PROVIDER_SITE_OTHER)

## 2020-12-17 ENCOUNTER — Other Ambulatory Visit: Payer: Self-pay | Admitting: Internal Medicine

## 2020-12-17 DIAGNOSIS — E7800 Pure hypercholesterolemia, unspecified: Secondary | ICD-10-CM

## 2020-12-17 DIAGNOSIS — R739 Hyperglycemia, unspecified: Secondary | ICD-10-CM | POA: Diagnosis not present

## 2020-12-17 DIAGNOSIS — R0600 Dyspnea, unspecified: Secondary | ICD-10-CM

## 2020-12-17 DIAGNOSIS — E538 Deficiency of other specified B group vitamins: Secondary | ICD-10-CM

## 2020-12-17 DIAGNOSIS — E559 Vitamin D deficiency, unspecified: Secondary | ICD-10-CM

## 2020-12-17 LAB — CBC WITH DIFFERENTIAL/PLATELET
Basophils Absolute: 0 10*3/uL (ref 0.0–0.1)
Basophils Relative: 0.8 % (ref 0.0–3.0)
Eosinophils Absolute: 0.1 10*3/uL (ref 0.0–0.7)
Eosinophils Relative: 2.4 % (ref 0.0–5.0)
HCT: 39.8 % (ref 36.0–46.0)
Hemoglobin: 13.8 g/dL (ref 12.0–15.0)
Lymphocytes Relative: 37 % (ref 12.0–46.0)
Lymphs Abs: 1.9 10*3/uL (ref 0.7–4.0)
MCHC: 34.7 g/dL (ref 30.0–36.0)
MCV: 90.9 fl (ref 78.0–100.0)
Monocytes Absolute: 0.5 10*3/uL (ref 0.1–1.0)
Monocytes Relative: 9.4 % (ref 3.0–12.0)
Neutro Abs: 2.6 10*3/uL (ref 1.4–7.7)
Neutrophils Relative %: 50.4 % (ref 43.0–77.0)
Platelets: 332 10*3/uL (ref 150.0–400.0)
RBC: 4.37 Mil/uL (ref 3.87–5.11)
RDW: 12.6 % (ref 11.5–15.5)
WBC: 5.2 10*3/uL (ref 4.0–10.5)

## 2020-12-17 LAB — LIPID PANEL
Cholesterol: 234 mg/dL — ABNORMAL HIGH (ref 0–200)
HDL: 77.7 mg/dL (ref >39.00)
LDL Cholesterol: 142 mg/dL — ABNORMAL HIGH (ref 0–99)
NonHDL: 156.6
Total CHOL/HDL Ratio: 3
Triglycerides: 72 mg/dL (ref 0.0–149.0)
VLDL: 14.4 mg/dL (ref 0.0–40.0)

## 2020-12-17 LAB — HEPATIC FUNCTION PANEL
ALT: 12 U/L (ref 0–35)
AST: 16 U/L (ref 0–37)
Albumin: 4.4 g/dL (ref 3.5–5.2)
Alkaline Phosphatase: 69 U/L (ref 39–117)
Bilirubin, Direct: 0.2 mg/dL (ref 0.0–0.3)
Total Bilirubin: 0.9 mg/dL (ref 0.2–1.2)
Total Protein: 7.1 g/dL (ref 6.0–8.3)

## 2020-12-17 LAB — BASIC METABOLIC PANEL
BUN: 11 mg/dL (ref 6–23)
CO2: 28 mEq/L (ref 19–32)
Calcium: 9.6 mg/dL (ref 8.4–10.5)
Chloride: 101 mEq/L (ref 96–112)
Creatinine, Ser: 0.77 mg/dL (ref 0.40–1.20)
GFR: 83.41 mL/min (ref >60.00)
Glucose, Bld: 93 mg/dL (ref 70–99)
Potassium: 4.2 mEq/L (ref 3.5–5.1)
Sodium: 136 mEq/L (ref 135–145)

## 2020-12-17 LAB — URINALYSIS, ROUTINE W REFLEX MICROSCOPIC
Bilirubin Urine: NEGATIVE
Hgb urine dipstick: NEGATIVE
Ketones, ur: NEGATIVE
Leukocytes,Ua: NEGATIVE
Nitrite: NEGATIVE
Specific Gravity, Urine: 1.01 (ref 1.000–1.030)
Total Protein, Urine: NEGATIVE
Urine Glucose: NEGATIVE
Urobilinogen, UA: 0.2 (ref 0.0–1.0)
WBC, UA: NONE SEEN (ref 0–?)
pH: 7.5 (ref 5.0–8.0)

## 2020-12-17 LAB — HEMOGLOBIN A1C: Hgb A1c MFr Bld: 5.3 % (ref 4.6–6.5)

## 2020-12-17 LAB — VITAMIN D 25 HYDROXY (VIT D DEFICIENCY, FRACTURES): VITD: 65.73 ng/mL (ref 30.00–100.00)

## 2020-12-17 LAB — VITAMIN B12: Vitamin B-12: 516 pg/mL (ref 211–911)

## 2020-12-17 LAB — TSH: TSH: 2.1 u[IU]/mL (ref 0.35–5.50)

## 2020-12-17 LAB — BRAIN NATRIURETIC PEPTIDE: Pro B Natriuretic peptide (BNP): 17 pg/mL (ref 0.0–100.0)

## 2020-12-18 ENCOUNTER — Encounter: Payer: Self-pay | Admitting: Internal Medicine

## 2020-12-19 ENCOUNTER — Encounter: Payer: Self-pay | Admitting: Internal Medicine

## 2020-12-19 DIAGNOSIS — R0600 Dyspnea, unspecified: Secondary | ICD-10-CM | POA: Insufficient documentation

## 2020-12-19 NOTE — Assessment & Plan Note (Signed)

## 2020-12-19 NOTE — Assessment & Plan Note (Signed)
Lab Results  Component Value Date   LDLCALC 142 (H) 12/17/2020   Stable, pt to continue current low chol diet, declines statin for now, consider cardiac CT score

## 2020-12-19 NOTE — Assessment & Plan Note (Signed)
Very mild but unsuual, exam benign, for cxr

## 2020-12-31 ENCOUNTER — Encounter: Payer: Self-pay | Admitting: Internal Medicine

## 2021-01-03 ENCOUNTER — Encounter: Admitting: Internal Medicine

## 2021-06-18 ENCOUNTER — Encounter: Payer: Self-pay | Admitting: Gastroenterology

## 2021-10-18 ENCOUNTER — Other Ambulatory Visit: Payer: Self-pay | Admitting: Internal Medicine

## 2021-10-18 DIAGNOSIS — Z8249 Family history of ischemic heart disease and other diseases of the circulatory system: Secondary | ICD-10-CM

## 2021-12-07 ENCOUNTER — Inpatient Hospital Stay: Admission: RE | Admit: 2021-12-07 | Source: Ambulatory Visit

## 2021-12-12 ENCOUNTER — Other Ambulatory Visit (HOSPITAL_BASED_OUTPATIENT_CLINIC_OR_DEPARTMENT_OTHER)

## 2021-12-14 ENCOUNTER — Ambulatory Visit (HOSPITAL_BASED_OUTPATIENT_CLINIC_OR_DEPARTMENT_OTHER)
Admission: RE | Admit: 2021-12-14 | Discharge: 2021-12-14 | Disposition: A | Discharge status: Home / Self Care | Source: Ambulatory Visit | Attending: Internal Medicine | Admitting: Internal Medicine

## 2021-12-14 DIAGNOSIS — Z8249 Family history of ischemic heart disease and other diseases of the circulatory system: Secondary | ICD-10-CM | POA: Insufficient documentation

## 2022-01-05 ENCOUNTER — Ambulatory Visit (INDEPENDENT_AMBULATORY_CARE_PROVIDER_SITE_OTHER): Admitting: Internal Medicine

## 2022-01-05 ENCOUNTER — Encounter: Payer: Self-pay | Admitting: Internal Medicine

## 2022-01-05 VITALS — BP 120/68 | HR 68 | Temp 99.0°F | Ht 66.0 in | Wt 149.0 lb

## 2022-01-05 DIAGNOSIS — R739 Hyperglycemia, unspecified: Secondary | ICD-10-CM | POA: Diagnosis not present

## 2022-01-05 DIAGNOSIS — Z0000 Encounter for general adult medical examination without abnormal findings: Secondary | ICD-10-CM | POA: Diagnosis not present

## 2022-01-05 DIAGNOSIS — E538 Deficiency of other specified B group vitamins: Secondary | ICD-10-CM

## 2022-01-05 DIAGNOSIS — E559 Vitamin D deficiency, unspecified: Secondary | ICD-10-CM | POA: Diagnosis not present

## 2022-01-05 DIAGNOSIS — E7800 Pure hypercholesterolemia, unspecified: Secondary | ICD-10-CM

## 2022-01-05 LAB — URINALYSIS, ROUTINE W REFLEX MICROSCOPIC
Bilirubin Urine: NEGATIVE
Hgb urine dipstick: NEGATIVE
Ketones, ur: NEGATIVE
Leukocytes,Ua: NEGATIVE
Nitrite: NEGATIVE
Specific Gravity, Urine: 1.01 (ref 1.000–1.030)
Total Protein, Urine: NEGATIVE
Urine Glucose: NEGATIVE
Urobilinogen, UA: 0.2 (ref 0.0–1.0)
pH: 8.5 — AB (ref 5.0–8.0)

## 2022-01-05 LAB — CBC WITH DIFFERENTIAL/PLATELET
Basophils Absolute: 0 10*3/uL (ref 0.0–0.1)
Basophils Relative: 0.7 % (ref 0.0–3.0)
Eosinophils Absolute: 0.1 10*3/uL (ref 0.0–0.7)
Eosinophils Relative: 1.8 % (ref 0.0–5.0)
HCT: 40.7 % (ref 36.0–46.0)
Hemoglobin: 13.6 g/dL (ref 12.0–15.0)
Lymphocytes Relative: 32.5 % (ref 12.0–46.0)
Lymphs Abs: 2.1 10*3/uL (ref 0.7–4.0)
MCHC: 33.5 g/dL (ref 30.0–36.0)
MCV: 93 fl (ref 78.0–100.0)
Monocytes Absolute: 0.5 10*3/uL (ref 0.1–1.0)
Monocytes Relative: 7.1 % (ref 3.0–12.0)
Neutro Abs: 3.7 10*3/uL (ref 1.4–7.7)
Neutrophils Relative %: 57.9 % (ref 43.0–77.0)
Platelets: 358 10*3/uL (ref 150.0–400.0)
RBC: 4.37 Mil/uL (ref 3.87–5.11)
RDW: 12.5 % (ref 11.5–15.5)
WBC: 6.4 10*3/uL (ref 4.0–10.5)

## 2022-01-05 LAB — HEPATIC FUNCTION PANEL
ALT: 15 U/L (ref 0–35)
AST: 21 U/L (ref 0–37)
Albumin: 4.6 g/dL (ref 3.5–5.2)
Alkaline Phosphatase: 81 U/L (ref 39–117)
Bilirubin, Direct: 0.1 mg/dL (ref 0.0–0.3)
Total Bilirubin: 0.8 mg/dL (ref 0.2–1.2)
Total Protein: 7.2 g/dL (ref 6.0–8.3)

## 2022-01-05 LAB — LIPID PANEL
Cholesterol: 219 mg/dL — ABNORMAL HIGH (ref 0–200)
HDL: 68.8 mg/dL (ref >39.00)
LDL Cholesterol: 136 mg/dL — ABNORMAL HIGH (ref 0–99)
NonHDL: 149.73
Total CHOL/HDL Ratio: 3
Triglycerides: 71 mg/dL (ref 0.0–149.0)
VLDL: 14.2 mg/dL (ref 0.0–40.0)

## 2022-01-05 LAB — TSH: TSH: 1.05 u[IU]/mL (ref 0.35–5.50)

## 2022-01-05 LAB — BASIC METABOLIC PANEL
BUN: 9 mg/dL (ref 6–23)
CO2: 28 mEq/L (ref 19–32)
Calcium: 9.8 mg/dL (ref 8.4–10.5)
Chloride: 101 mEq/L (ref 96–112)
Creatinine, Ser: 0.78 mg/dL (ref 0.40–1.20)
GFR: 81.52 mL/min (ref >60.00)
Glucose, Bld: 96 mg/dL (ref 70–99)
Potassium: 4.9 mEq/L (ref 3.5–5.1)
Sodium: 136 mEq/L (ref 135–145)

## 2022-01-05 LAB — VITAMIN D 25 HYDROXY (VIT D DEFICIENCY, FRACTURES): VITD: 52.08 ng/mL (ref 30.00–100.00)

## 2022-01-05 LAB — VITAMIN B12: Vitamin B-12: 466 pg/mL (ref 211–911)

## 2022-01-05 LAB — HEMOGLOBIN A1C: Hgb A1c MFr Bld: 5.4 % (ref 4.6–6.5)

## 2022-01-05 NOTE — Progress Notes (Signed)
Patient ID: ASNA MULDROW, female   DOB: [birth year], 61 y.o.   MRN: [id number]         Chief Complaint:: wellness exam and hld       HPI:  Mandy Watkins is a 61 y.o. female here for wellness exam; had neg cologuard probalby 1 yr ago - declines colonoscopy, to see GYN for pap and mammogram, o/w up to date                        Also has ongoing mild hld but also Card Ct score zero recently.  Pt denies chest pain, increased sob or doe, wheezing, orthopnea, PND, increased LE swelling, palpitations, dizziness or syncope.   Pt denies polydipsia, polyuria, or new focal neuro s/s.    Pt denies fever, wt loss, night sweats, loss of appetite, or other constitutional symptoms     Wt Readings from Last 3 Encounters:  01/05/22 149 lb (67.6 kg)  12/16/20 147 lb (66.7 kg)  09/30/19 153 lb (69.4 kg)   BP Readings from Last 3 Encounters:  01/05/22 120/68  12/16/20 112/70  09/30/19 120/72   Immunization History  Administered Date(s) Administered   Influenza Split 03/27/2011, 04/03/2012   Influenza-Unspecified 03/26/2017   PFIZER(Purple Top)SARS-COV-2 Vaccination 08/09/2019, 09/12/2019, 05/05/2020, 12/24/2020   PPD Test 10/24/2010   Tdap 03/27/2011, 10/30/2015   Zoster Recombinat (Shingrix) 06/17/2018, 08/19/2018   Zoster, Live 03/14/2016   There are no preventive care reminders to display for this patient.     Past Medical History:  Diagnosis Date   ALLERGIC RHINITIS    CARPAL TUNNEL SYNDROME, RIGHT    Hyperlipidemia    SEIZURE DISORDER    as child   Varicose vein of leg    prior ablation   VITAMIN B12 DEFICIENCY dx 06/26/2010   Past Surgical History:  Procedure Laterality Date   VARICOSE VEIN SURGERY     sclerosis tx    reports that she has never smoked. She has never used smokeless tobacco. She reports current alcohol use. She reports that she does not use drugs. family history includes Diabetes in her father and mother; Hypertension in her father and mother. Allergies   Allergen Reactions   Cephalexin Rash and Hives   Sulfa Antibiotics Hives   Sulfonamide Derivatives Hives   Amoxicillin-Pot Clavulanate Rash   Current Outpatient Medications on File Prior to Visit  Medication Sig Dispense Refill   aspirin 81 MG tablet Take 81 mg by mouth 3 (three) times a week.     Cholecalciferol 25 MCG (1000 UT) capsule Take by mouth.     Multiple Vitamins-Minerals (THERA-M) TABS Take by mouth.     Omega-3 Fatty Acids (FISH OIL) 1000 MG CAPS Take 1,000 mg by mouth daily.       No current facility-administered medications on file prior to visit.        ROS:  All others reviewed and negative.  Objective        PE:  BP 120/68 (BP Location: Right Arm, Patient Position: Sitting, Cuff Size: Normal)   Pulse 68   Temp 99 F (37.2 C) (Oral)   Ht 5\' 6"  (1.676 m)   Wt 149 lb (67.6 kg)   SpO2 98%   BMI 24.05 kg/m                 Constitutional: Pt appears in NAD               HENT: Head: NCAT.  Right Ear: External ear normal.                 Left Ear: External ear normal.                Eyes: . Pupils are equal, round, and reactive to light. Conjunctivae and EOM are normal               Nose: without d/c or deformity               Neck: Neck supple. Gross normal ROM               Cardiovascular: Normal rate and regular rhythm.                 Pulmonary/Chest: Effort normal and breath sounds without rales or wheezing.                Abd:  Soft, NT, ND, + BS, no organomegaly               Neurological: Pt is alert. At baseline orientation, motor grossly intact               Skin: Skin is warm. No rashes, no other new lesions, LE edema - none               Psychiatric: Pt behavior is normal without agitation   Micro: none  Cardiac tracings I have personally interpreted today:  none  Pertinent Radiological findings (summarize): none   Lab Results  Component Value Date   WBC 6.4 01/05/2022   HGB 13.6 01/05/2022   HCT 40.7 01/05/2022   PLT 358.0  01/05/2022   GLUCOSE 96 01/05/2022   CHOL 219 (H) 01/05/2022   TRIG 71.0 01/05/2022   HDL 68.80 01/05/2022   LDLDIRECT 116.9 03/27/2011   LDLCALC 136 (H) 01/05/2022   ALT 15 01/05/2022   AST 21 01/05/2022   NA 136 01/05/2022   K 4.9 01/05/2022   CL 101 01/05/2022   CREATININE 0.78 01/05/2022   BUN 9 01/05/2022   CO2 28 01/05/2022   TSH 1.05 01/05/2022   HGBA1C 5.4 01/05/2022   Assessment/Plan:  Mandy Watkins is a 61 y.o. White or Caucasian [1] female with  has a past medical history of ALLERGIC RHINITIS, CARPAL TUNNEL SYNDROME, RIGHT, Hyperlipidemia, SEIZURE DISORDER, Varicose vein of leg, and VITAMIN B12 DEFICIENCY (dx 06/26/2010).  B12 deficiency Lab Results  Component Value Date   VITAMINB12 516 12/17/2020   Stable, cont oral replacement - b12 1000 mcg qd   Hyperlipidemia Lab Results  Component Value Date   LDLCALC 142 (H) 12/17/2020   uncontrolled, pt to continue current low chol diet, declines statin as recent CT Card Score is Zero   Preventative health care Age and sex appropriate education and counseling updated with regular exercise and diet Referrals for preventative services - pt to see GYN, had recent cologuard negative Immunizations addressed - none needed Smoking counseling  - none needed Evidence for depression or other mood disorder - none significant Most recent labs reviewed. I have personally reviewed and have noted: 1) the patient's medical and social history 2) The patient's current medications and supplements 3) The patient's height, weight, and BMI have been recorded in the chart  Followup: Return in about 1 year (around 01/06/2023).  Oliver Barre, MD 01/08/2022 10:58 AM [facility] - [facility]

## 2022-01-05 NOTE — Assessment & Plan Note (Addendum)
Lab Results  Component Value Date   LDLCALC 142 (H) 12/17/2020   uncontrolled, pt to continue current low chol diet, declines statin as recent CT Card Score is Zero

## 2022-01-05 NOTE — Assessment & Plan Note (Signed)
Lab Results  Component Value Date   VITAMINB12 516 12/17/2020   Stable, cont oral replacement - b12 1000 mcg qd

## 2022-01-05 NOTE — Patient Instructions (Signed)

## 2022-01-08 ENCOUNTER — Encounter: Payer: Self-pay | Admitting: Internal Medicine

## 2022-01-08 NOTE — Assessment & Plan Note (Signed)
Age and sex appropriate education and counseling updated with regular exercise and diet Referrals for preventative services - pt to see GYN, had recent cologuard negative Immunizations addressed - none needed Smoking counseling  - none needed Evidence for depression or other mood disorder - none significant Most recent labs reviewed. I have personally reviewed and have noted: 1) the patient's medical and social history 2) The patient's current medications and supplements 3) The patient's height, weight, and BMI have been recorded in the chart

## 2022-08-11 IMAGING — DX DG CHEST 2V
2 series · 2 of 2 positions shown · non-contrast
Comparison: None.

CLINICAL DATA: 60-year-old female with shortness of breath.

EXAM:
CHEST - 2 VIEW

[chest pa]
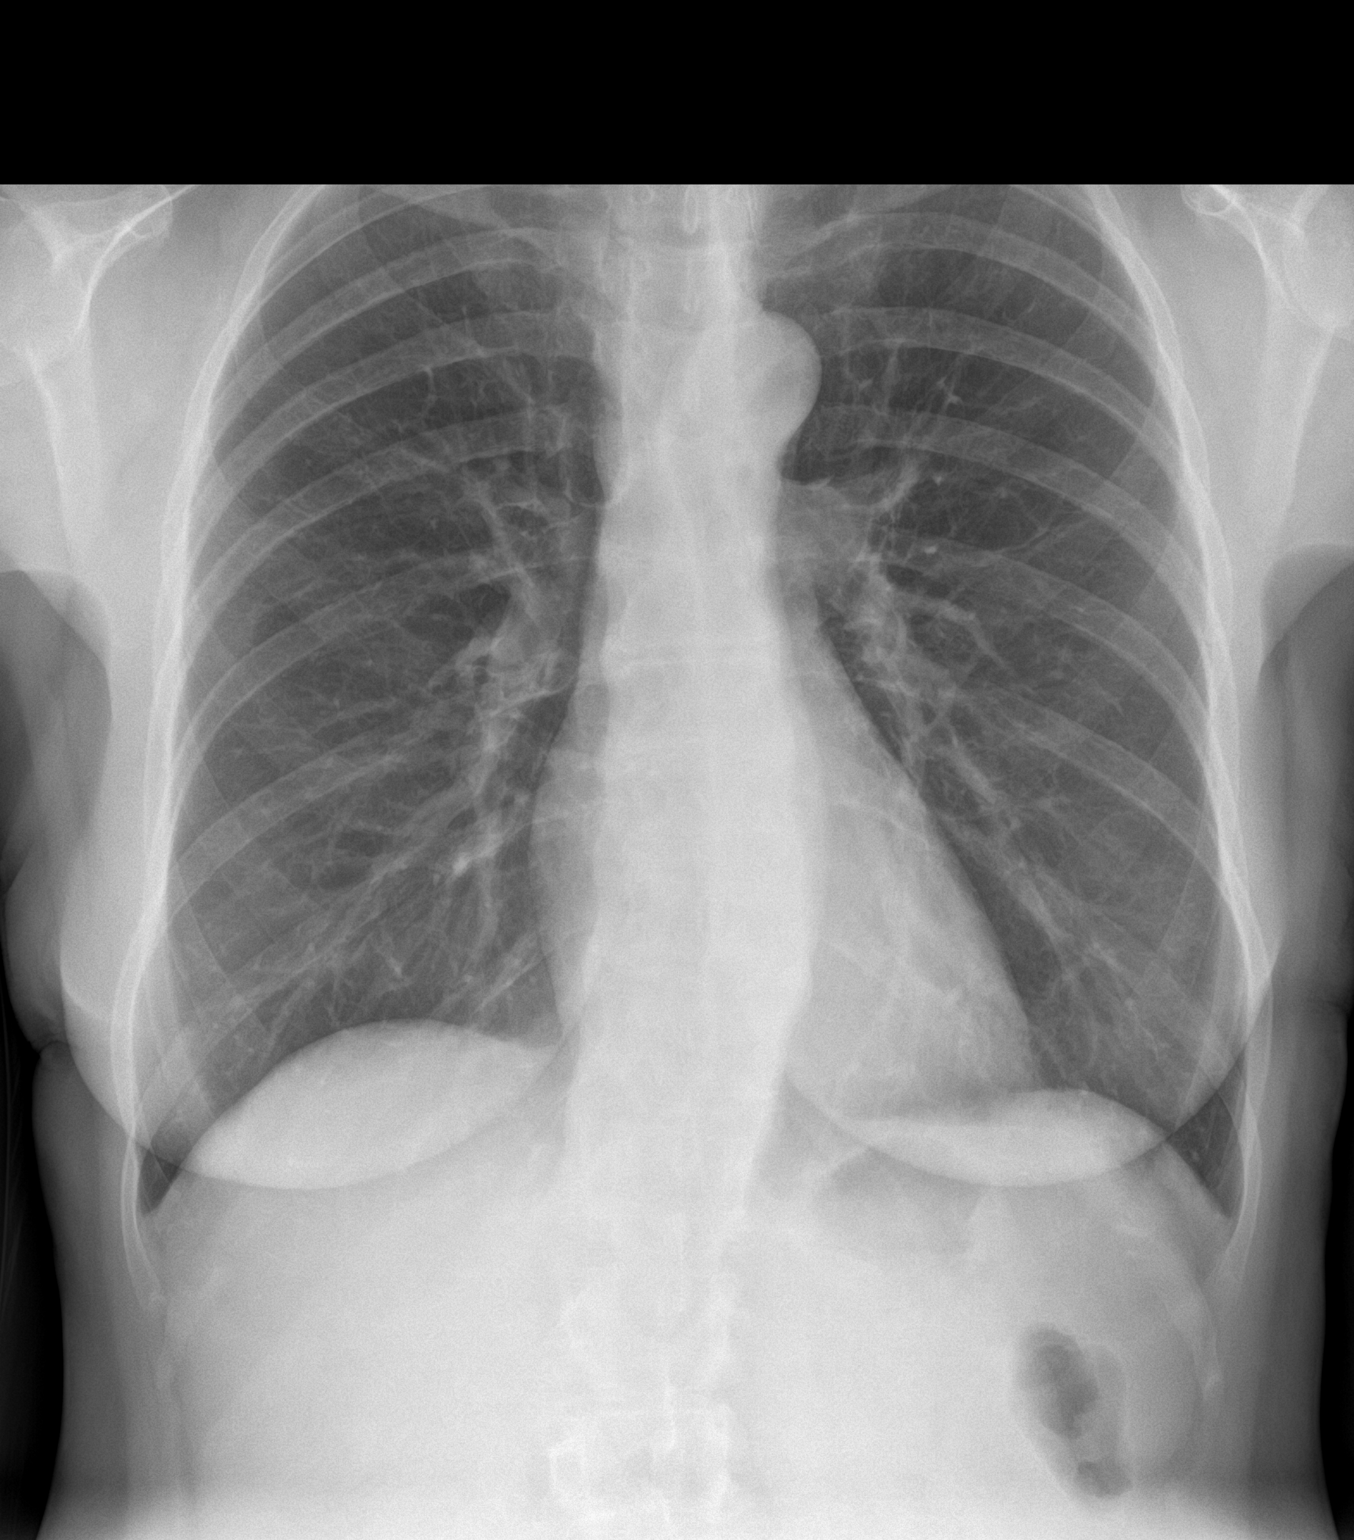

[chest lat]
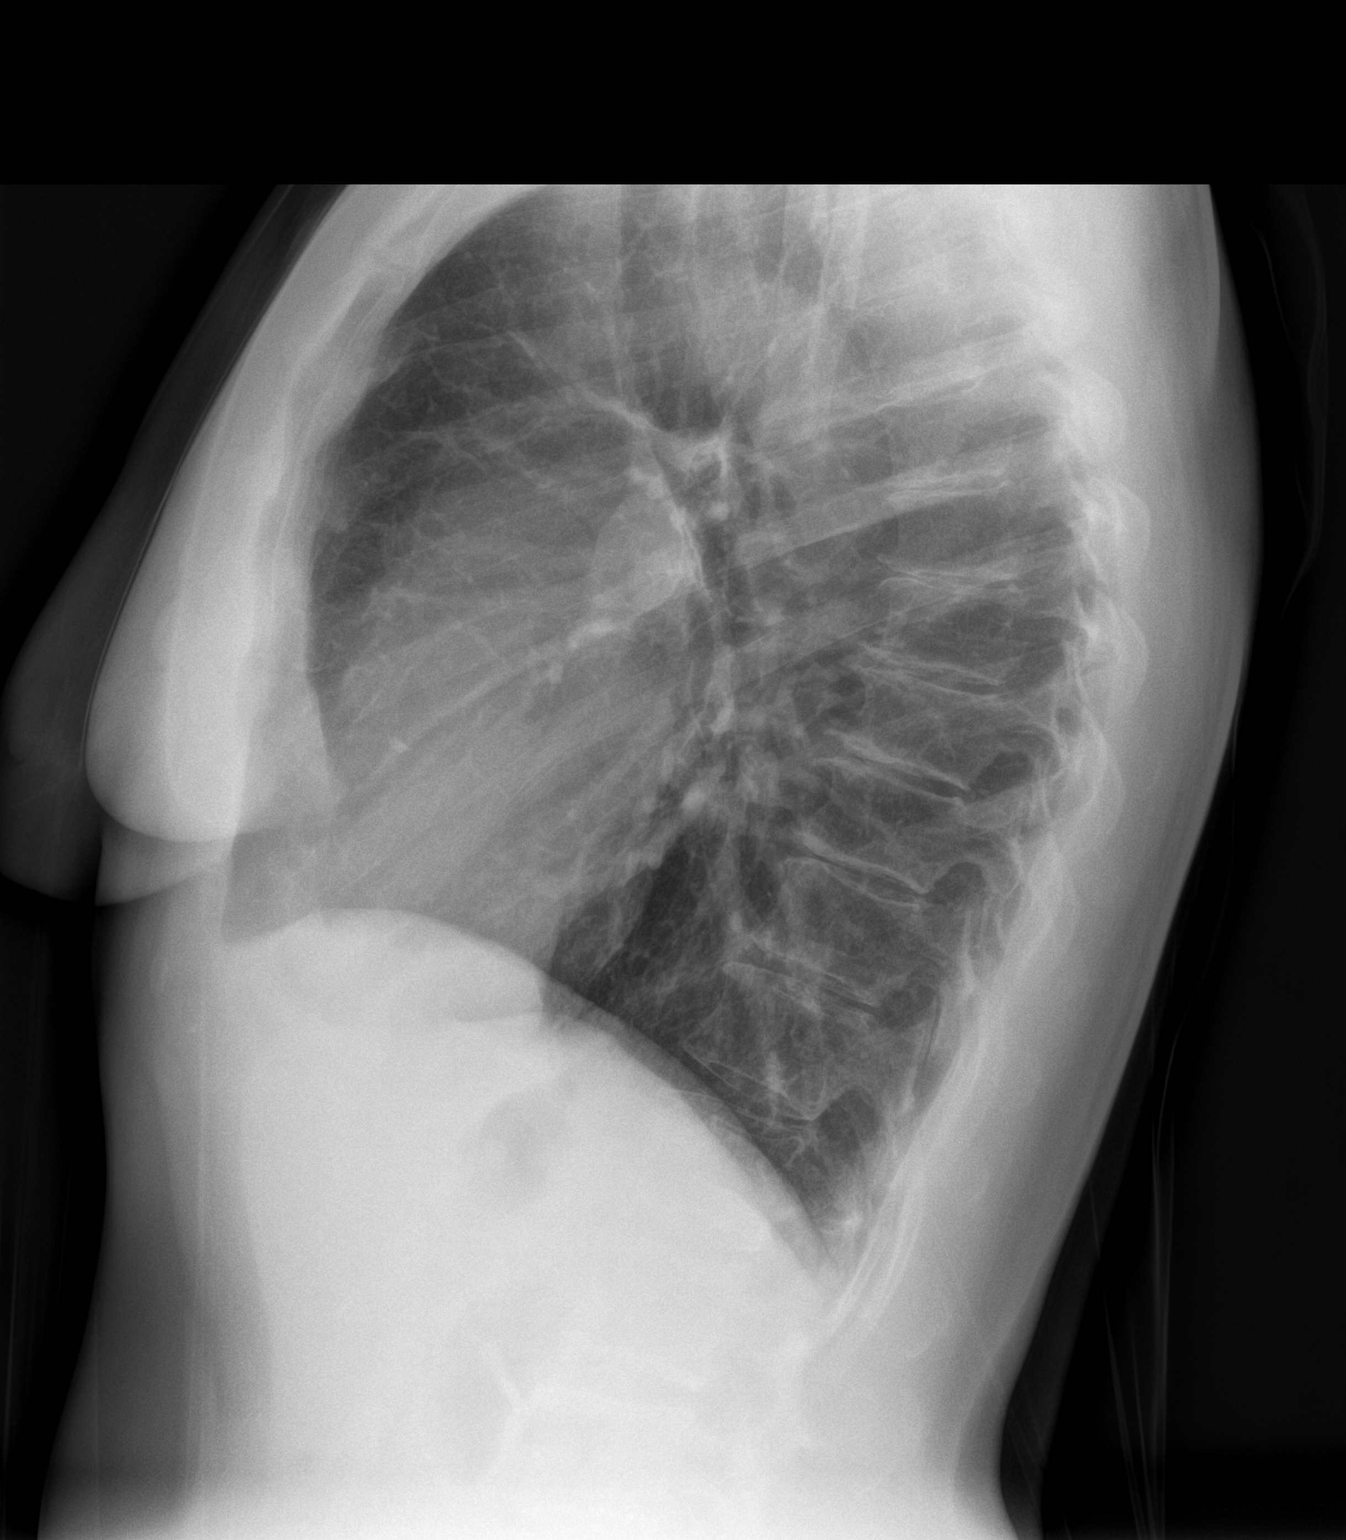

[2 of 2 positions shown; findings below may reference images not displayed]

FINDINGS: The lungs are clear. There is no pleural effusion pneumothorax. The
cardiac silhouette is within limits. No acute osseous pathology.
Degenerative changes of the spine.
IMPRESSION: No active cardiopulmonary disease.

## 2023-01-09 ENCOUNTER — Ambulatory Visit (INDEPENDENT_AMBULATORY_CARE_PROVIDER_SITE_OTHER): Admitting: Internal Medicine

## 2023-01-09 VITALS — BP 124/78 | HR 71 | Temp 98.4°F | Ht 66.0 in | Wt 152.2 lb

## 2023-01-09 DIAGNOSIS — E559 Vitamin D deficiency, unspecified: Secondary | ICD-10-CM

## 2023-01-09 DIAGNOSIS — E7800 Pure hypercholesterolemia, unspecified: Secondary | ICD-10-CM

## 2023-01-09 DIAGNOSIS — Z1211 Encounter for screening for malignant neoplasm of colon: Secondary | ICD-10-CM

## 2023-01-09 DIAGNOSIS — R739 Hyperglycemia, unspecified: Secondary | ICD-10-CM

## 2023-01-09 DIAGNOSIS — Z0001 Encounter for general adult medical examination with abnormal findings: Secondary | ICD-10-CM

## 2023-01-09 DIAGNOSIS — E538 Deficiency of other specified B group vitamins: Secondary | ICD-10-CM

## 2023-01-09 LAB — HEPATIC FUNCTION PANEL
ALT: 17 U/L (ref 0–35)
AST: 20 U/L (ref 0–37)
Albumin: 4.4 g/dL (ref 3.5–5.2)
Alkaline Phosphatase: 70 U/L (ref 39–117)
Bilirubin, Direct: 0.1 mg/dL (ref 0.0–0.3)
Total Bilirubin: 0.8 mg/dL (ref 0.2–1.2)
Total Protein: 7.2 g/dL (ref 6.0–8.3)

## 2023-01-09 LAB — CBC WITH DIFFERENTIAL/PLATELET
Basophils Absolute: 0 10*3/uL (ref 0.0–0.1)
Basophils Relative: 0.6 % (ref 0.0–3.0)
Eosinophils Absolute: 0.2 10*3/uL (ref 0.0–0.7)
Eosinophils Relative: 2.6 % (ref 0.0–5.0)
HCT: 42 % (ref 36.0–46.0)
Hemoglobin: 13.6 g/dL (ref 12.0–15.0)
Lymphocytes Relative: 26.9 % (ref 12.0–46.0)
Lymphs Abs: 2.1 10*3/uL (ref 0.7–4.0)
MCHC: 32.5 g/dL (ref 30.0–36.0)
MCV: 93.8 fl (ref 78.0–100.0)
Monocytes Absolute: 0.5 10*3/uL (ref 0.1–1.0)
Monocytes Relative: 6.5 % (ref 3.0–12.0)
Neutro Abs: 5 10*3/uL (ref 1.4–7.7)
Neutrophils Relative %: 63.4 % (ref 43.0–77.0)
Platelets: 380 10*3/uL (ref 150.0–400.0)
RBC: 4.47 Mil/uL (ref 3.87–5.11)
RDW: 12.6 % (ref 11.5–15.5)
WBC: 7.9 10*3/uL (ref 4.0–10.5)

## 2023-01-09 LAB — BASIC METABOLIC PANEL
BUN: 9 mg/dL (ref 6–23)
CO2: 27 mEq/L (ref 19–32)
Calcium: 9.8 mg/dL (ref 8.4–10.5)
Chloride: 101 mEq/L (ref 96–112)
Creatinine, Ser: 0.73 mg/dL (ref 0.40–1.20)
GFR: 87.65 mL/min (ref >60.00)
Glucose, Bld: 95 mg/dL (ref 70–99)
Potassium: 3.8 mEq/L (ref 3.5–5.1)
Sodium: 137 mEq/L (ref 135–145)

## 2023-01-09 LAB — LIPID PANEL
Cholesterol: 213 mg/dL — ABNORMAL HIGH (ref 0–200)
HDL: 78.8 mg/dL (ref >39.00)
LDL Cholesterol: 116 mg/dL — ABNORMAL HIGH (ref 0–99)
NonHDL: 133.94
Total CHOL/HDL Ratio: 3
Triglycerides: 89 mg/dL (ref 0.0–149.0)
VLDL: 17.8 mg/dL (ref 0.0–40.0)

## 2023-01-09 LAB — VITAMIN B12: Vitamin B-12: 479 pg/mL (ref 211–911)

## 2023-01-09 LAB — VITAMIN D 25 HYDROXY (VIT D DEFICIENCY, FRACTURES): VITD: 43.51 ng/mL (ref 30.00–100.00)

## 2023-01-09 LAB — HEMOGLOBIN A1C: Hgb A1c MFr Bld: 5.3 % (ref 4.6–6.5)

## 2023-01-09 LAB — TSH: TSH: 1.34 u[IU]/mL (ref 0.35–5.50)

## 2023-01-09 NOTE — Assessment & Plan Note (Signed)
.   Lab Results  Component Value Date   VITAMINB12 466 01/05/2022   Stable, cont oral replacement - b12 1000 mcg qd

## 2023-01-09 NOTE — Patient Instructions (Addendum)
You will be contacted regarding the referral for: cologuard  Please continue all other medications as before, and refills have been done if requested.  Please have the pharmacy call with any other refills you may need.  Please continue your efforts at being more active, low cholesterol diet, and weight control.  You are otherwise up to date with prevention measures today.  Please keep your appointments with your specialists as you may have planned  Please go to the LAB at the blood drawing area for the tests to be done  You will be contacted by phone if any changes need to be made immediately.  Otherwise, you will receive a letter about your results with an explanation, but please check with MyChart first.  Please make an Appointment to return for your 1 year visit, or sooner if needed

## 2023-01-09 NOTE — Progress Notes (Signed)
Patient ID: Mandy Watkins, female   DOB: 1961/03/08, 62 y.o.   MRN: [id number]         Chief Complaint:: wellness exam and hld       HPI:  Mandy Watkins is a 62 y.o. female here for wellness exam; to see gyn for pap and mamogram, for flu shot soon, declines colonoscopy but will do cologuard, o/w up to date                        Also trying to follow lower chol diet, declines statin for now.  Pt denies chest pain, increased sob or doe, wheezing, orthopnea, PND, increased LE swelling, palpitations, dizziness or syncope.   Pt denies polydipsia, polyuria, or new focal neuro s/s.    Pt denies fever, wt loss, night sweats, loss of appetite, or other constitutional symptoms     Wt Readings from Last 3 Encounters:  01/09/23 152 lb 3.2 oz (69 kg)  01/05/22 149 lb (67.6 kg)  12/16/20 147 lb (66.7 kg)   BP Readings from Last 3 Encounters:  01/09/23 124/78  01/05/22 120/68  12/16/20 112/70   Immunization History  Administered Date(s) Administered   Influenza Split 03/27/2011, 04/03/2012   Influenza-Unspecified 03/26/2017   PFIZER(Purple Top)SARS-COV-2 Vaccination 08/09/2019, 09/12/2019, 05/05/2020, 12/24/2020   PPD Test 10/24/2010   Tdap 03/27/2011, 10/30/2015   Zoster Recombinant(Shingrix) 06/17/2018, 08/19/2018   Zoster, Live 03/14/2016   Health Maintenance Due  Topic Date Due   Colonoscopy  04/25/2021   MAMMOGRAM  10/12/2021   PAP SMEAR-Modifier  10/20/2021   INFLUENZA VACCINE  01/04/2023      Past Medical History:  Diagnosis Date   ALLERGIC RHINITIS    CARPAL TUNNEL SYNDROME, RIGHT    Hyperlipidemia    SEIZURE DISORDER    as child   Varicose vein of leg    prior ablation   VITAMIN B12 DEFICIENCY dx 06/26/2010   Past Surgical History:  Procedure Laterality Date   VARICOSE VEIN SURGERY     sclerosis tx    reports that she has never smoked. She has never used smokeless tobacco. She reports current alcohol use. She reports that she does not use drugs. family  history includes Diabetes in her father and mother; Hypertension in her father and mother. Allergies  Allergen Reactions   Cephalexin Rash and Hives   Sulfa Antibiotics Hives   Sulfonamide Derivatives Hives   Amoxicillin-Pot Clavulanate Rash   Current Outpatient Medications on File Prior to Visit  Medication Sig Dispense Refill   aspirin 81 MG tablet Take 81 mg by mouth 3 (three) times a week.     Cholecalciferol 25 MCG (1000 UT) capsule Take by mouth.     Multiple Vitamins-Minerals (THERA-M) TABS Take by mouth.     Omega-3 Fatty Acids (FISH OIL) 1000 MG CAPS Take 1,000 mg by mouth daily.       No current facility-administered medications on file prior to visit.        ROS:  All others reviewed and negative.  Objective        PE:  BP 124/78 (BP Location: Left Arm, Patient Position: Sitting, Cuff Size: Normal)   Pulse 71   Temp 98.4 F (36.9 C) (Oral)   Ht 5\' 6"  (1.676 m)   Wt 152 lb 3.2 oz (69 kg)   SpO2 98%   BMI 24.57 kg/m                 Constitutional:  Pt appears in NAD               HENT: Head: NCAT.                Right Ear: External ear normal.                 Left Ear: External ear normal.                Eyes: . Pupils are equal, round, and reactive to light. Conjunctivae and EOM are normal               Nose: without d/c or deformity               Neck: Neck supple. Gross normal ROM               Cardiovascular: Normal rate and regular rhythm.                 Pulmonary/Chest: Effort normal and breath sounds without rales or wheezing.                Abd:  Soft, NT, ND, + BS, no organomegaly               Neurological: Pt is alert. At baseline orientation, motor grossly intact               Skin: Skin is warm. No rashes, no other new lesions, LE edema - none               Psychiatric: Pt behavior is normal without agitation   Micro: none  Cardiac tracings I have personally interpreted today:  none  Pertinent Radiological findings (summarize): none   Lab  Results  Component Value Date   WBC 7.9 01/09/2023   HGB 13.6 01/09/2023   HCT 42.0 01/09/2023   PLT 380.0 01/09/2023   GLUCOSE 95 01/09/2023   CHOL 213 (H) 01/09/2023   TRIG 89.0 01/09/2023   HDL 78.80 01/09/2023   LDLDIRECT 116.9 03/27/2011   LDLCALC 116 (H) 01/09/2023   ALT 17 01/09/2023   AST 20 01/09/2023   NA 137 01/09/2023   K 3.8 01/09/2023   CL 101 01/09/2023   CREATININE 0.73 01/09/2023   BUN 9 01/09/2023   CO2 27 01/09/2023   TSH 1.34 01/09/2023   HGBA1C 5.3 01/09/2023   Assessment/Plan:  Mandy Watkins is a 62 y.o. White or Caucasian [1] female with  has a past medical history of ALLERGIC RHINITIS, CARPAL TUNNEL SYNDROME, RIGHT, Hyperlipidemia, SEIZURE DISORDER, Varicose vein of leg, and VITAMIN B12 DEFICIENCY (dx 06/26/2010).  B12 deficiency . Lab Results  Component Value Date   VITAMINB12 466 01/05/2022   Stable, cont oral replacement - b12 1000 mcg qd   Encounter for well adult exam with abnormal findings Age and sex appropriate education and counseling updated with regular exercise and diet Referrals for preventative services - none needed Immunizations addressed - none needed Smoking counseling  - none needed Evidence for depression or other mood disorder - none significant Most recent labs reviewed. I have personally reviewed and have noted: 1) the patient's medical and social history 2) The patient's current medications and supplements 3) The patient's height, weight, and BMI have been recorded in the chart   Hyperlipidemia Lab Results  Component Value Date   LDLCALC 116 (H) 01/09/2023   Unocntrolled,, pt to continue lower chol diet, declines statin for now  Followup: Return in about 1 year (around 01/09/2024).  Oliver Barre, MD 01/10/2023 8:44 PM [facility] - [facility]

## 2023-01-10 ENCOUNTER — Encounter: Payer: Self-pay | Admitting: Internal Medicine

## 2023-01-10 NOTE — Assessment & Plan Note (Signed)
Lab Results  Component Value Date   LDLCALC 116 (H) 01/09/2023   Unocntrolled,, pt to continue lower chol diet, declines statin for now

## 2023-01-10 NOTE — Assessment & Plan Note (Signed)

## 2024-01-11 ENCOUNTER — Encounter: Payer: Self-pay | Admitting: Internal Medicine

## 2024-01-11 ENCOUNTER — Ambulatory Visit: Admitting: Internal Medicine

## 2024-01-11 ENCOUNTER — Ambulatory Visit: Payer: Self-pay | Admitting: Internal Medicine

## 2024-01-11 VITALS — BP 120/76 | HR 68 | Temp 98.2°F | Ht 65.5 in | Wt 148.2 lb

## 2024-01-11 DIAGNOSIS — E7800 Pure hypercholesterolemia, unspecified: Secondary | ICD-10-CM

## 2024-01-11 DIAGNOSIS — M19049 Primary osteoarthritis, unspecified hand: Secondary | ICD-10-CM | POA: Insufficient documentation

## 2024-01-11 DIAGNOSIS — R739 Hyperglycemia, unspecified: Secondary | ICD-10-CM | POA: Diagnosis not present

## 2024-01-11 DIAGNOSIS — Z0000 Encounter for general adult medical examination without abnormal findings: Secondary | ICD-10-CM | POA: Diagnosis not present

## 2024-01-11 DIAGNOSIS — E559 Vitamin D deficiency, unspecified: Secondary | ICD-10-CM | POA: Diagnosis not present

## 2024-01-11 DIAGNOSIS — M19042 Primary osteoarthritis, left hand: Secondary | ICD-10-CM

## 2024-01-11 DIAGNOSIS — E538 Deficiency of other specified B group vitamins: Secondary | ICD-10-CM

## 2024-01-11 DIAGNOSIS — M19041 Primary osteoarthritis, right hand: Secondary | ICD-10-CM

## 2024-01-11 LAB — HEPATIC FUNCTION PANEL
ALT: 14 U/L (ref 0–35)
AST: 19 U/L (ref 0–37)
Albumin: 4.5 g/dL (ref 3.5–5.2)
Alkaline Phosphatase: 76 U/L (ref 39–117)
Bilirubin, Direct: 0.1 mg/dL (ref 0.0–0.3)
Total Bilirubin: 0.8 mg/dL (ref 0.2–1.2)
Total Protein: 7.3 g/dL (ref 6.0–8.3)

## 2024-01-11 LAB — BASIC METABOLIC PANEL WITH GFR
BUN: 10 mg/dL (ref 6–23)
CO2: 28 meq/L (ref 19–32)
Calcium: 9.7 mg/dL (ref 8.4–10.5)
Chloride: 100 meq/L (ref 96–112)
Creatinine, Ser: 0.71 mg/dL (ref 0.40–1.20)
GFR: 89.98 mL/min (ref >60.00)
Glucose, Bld: 85 mg/dL (ref 70–99)
Potassium: 4.4 meq/L (ref 3.5–5.1)
Sodium: 136 meq/L (ref 135–145)

## 2024-01-11 LAB — LIPID PANEL
Cholesterol: 219 mg/dL — ABNORMAL HIGH (ref 0–200)
HDL: 80.2 mg/dL (ref >39.00)
LDL Cholesterol: 125 mg/dL — ABNORMAL HIGH (ref 0–99)
NonHDL: 138.35
Total CHOL/HDL Ratio: 3
Triglycerides: 66 mg/dL (ref 0.0–149.0)
VLDL: 13.2 mg/dL (ref 0.0–40.0)

## 2024-01-11 LAB — URINALYSIS, ROUTINE W REFLEX MICROSCOPIC
Bilirubin Urine: NEGATIVE
Hgb urine dipstick: NEGATIVE
Ketones, ur: NEGATIVE
Leukocytes,Ua: NEGATIVE
Nitrite: NEGATIVE
Specific Gravity, Urine: <=1.005 — AB (ref 1.000–1.030)
Total Protein, Urine: NEGATIVE
Urine Glucose: NEGATIVE
Urobilinogen, UA: 0.2 (ref 0.0–1.0)
pH: 7.5 (ref 5.0–8.0)

## 2024-01-11 LAB — CBC WITH DIFFERENTIAL/PLATELET
Basophils Absolute: 0 K/uL (ref 0.0–0.1)
Basophils Relative: 0.7 % (ref 0.0–3.0)
Eosinophils Absolute: 0.1 K/uL (ref 0.0–0.7)
Eosinophils Relative: 1.9 % (ref 0.0–5.0)
HCT: 42.4 % (ref 36.0–46.0)
Hemoglobin: 14.2 g/dL (ref 12.0–15.0)
Lymphocytes Relative: 30.3 % (ref 12.0–46.0)
Lymphs Abs: 1.8 K/uL (ref 0.7–4.0)
MCHC: 33.6 g/dL (ref 30.0–36.0)
MCV: 92.2 fl (ref 78.0–100.0)
Monocytes Absolute: 0.6 K/uL (ref 0.1–1.0)
Monocytes Relative: 9.6 % (ref 3.0–12.0)
Neutro Abs: 3.4 K/uL (ref 1.4–7.7)
Neutrophils Relative %: 57.5 % (ref 43.0–77.0)
Platelets: 363 K/uL (ref 150.0–400.0)
RBC: 4.6 Mil/uL (ref 3.87–5.11)
RDW: 12.9 % (ref 11.5–15.5)
WBC: 5.9 K/uL (ref 4.0–10.5)

## 2024-01-11 LAB — VITAMIN D 25 HYDROXY (VIT D DEFICIENCY, FRACTURES): VITD: 48.24 ng/mL (ref 30.00–100.00)

## 2024-01-11 LAB — VITAMIN B12: Vitamin B-12: 349 pg/mL (ref 211–911)

## 2024-01-11 LAB — TSH: TSH: 1.46 u[IU]/mL (ref 0.35–5.50)

## 2024-01-11 LAB — HEMOGLOBIN A1C: Hgb A1c MFr Bld: 5.5 % (ref 4.6–6.5)

## 2024-01-11 NOTE — Assessment & Plan Note (Signed)
 Lab Results  Component Value Date   VITAMINB12 479 01/09/2023   Stable, cont oral replacement - b12 1000 mcg qd

## 2024-01-11 NOTE — Patient Instructions (Signed)

## 2024-01-11 NOTE — Progress Notes (Signed)
 Patient ID: Mandy Watkins, female   DOB: 1960-11-30, 63 y.o.   MRN: [id number]         Chief Complaint:: wellness exam and hld, low b12       HPI:  Mandy Watkins is a 63 y.o. female here for wellness exam; declines prevnar 20, for flu shot at pharmacy next month, o/w up to date                        Also Pt denies chest pain, increased sob or doe, wheezing, orthopnea, PND, increased LE swelling, palpitations, dizziness or syncope.  Pt denies polydipsia, polyuria, or new focal neuro s/s.    Pt denies fever, wt loss, night sweats, loss of appetite, or other constitutional symptoms     Wt Readings from Last 3 Encounters:  01/11/24 148 lb 4 oz (67.2 kg)  01/09/23 152 lb 3.2 oz (69 kg)  01/05/22 149 lb (67.6 kg)   BP Readings from Last 3 Encounters:  01/11/24 120/76  01/09/23 124/78  01/05/22 120/68   Immunization History  Administered Date(s) Administered   Influenza Split 03/27/2011, 04/03/2012   Influenza-Unspecified 03/26/2017   PFIZER(Purple Top)SARS-COV-2 Vaccination 08/09/2019, 09/12/2019, 05/05/2020, 12/24/2020   PPD Test 10/24/2010   Tdap 03/27/2011, 10/30/2015   Zoster Recombinant(Shingrix) 06/17/2018, 08/19/2018   Zoster, Live 03/14/2016   Health Maintenance Due  Topic Date Due   Pneumococcal Vaccine: 50+ Years (1 of 1 - PCV) Never done   Cervical Cancer Screening (HPV/Pap Cotest)  06/05/2014   INFLUENZA VACCINE  01/04/2024      Past Medical History:  Diagnosis Date   ALLERGIC RHINITIS    CARPAL TUNNEL SYNDROME, RIGHT    Hyperlipidemia    SEIZURE DISORDER    as child   Varicose vein of leg    prior ablation   VITAMIN B12 DEFICIENCY dx 06/26/2010   Past Surgical History:  Procedure Laterality Date   VARICOSE VEIN SURGERY     sclerosis tx    reports that she has never smoked. She has never used smokeless tobacco. She reports current alcohol use. She reports that she does not use drugs. family history includes Diabetes in her father and mother;  Hypertension in her father and mother. Allergies  Allergen Reactions   Cephalexin Rash and Hives   Sulfa Antibiotics Hives   Sulfonamide Derivatives Hives   Amoxicillin-Pot Clavulanate Rash   Current Outpatient Medications on File Prior to Visit  Medication Sig Dispense Refill   CALCIUM PO Take by mouth.     Cholecalciferol 25 MCG (1000 UT) capsule Take by mouth.     Multiple Vitamins-Minerals (THERA-M) TABS Take by mouth.     Omega-3 Fatty Acids (FISH OIL) 1000 MG CAPS Take 1,000 mg by mouth daily.       aspirin 81 MG tablet Take 81 mg by mouth 3 (three) times a week. (Patient not taking: Reported on 01/11/2024)     No current facility-administered medications on file prior to visit.        ROS:  All others reviewed and negative.  Objective        PE:  BP 120/76   Pulse 68   Temp 98.2 F (36.8 C) (Temporal)   Ht 5' 5.5 (1.664 m)   Wt 148 lb 4 oz (67.2 kg)   SpO2 95%   BMI 24.29 kg/m                 Constitutional: Pt appears in NAD  HENT: Head: NCAT.                Right Ear: External ear normal.                 Left Ear: External ear normal.                Eyes: . Pupils are equal, round, and reactive to light. Conjunctivae and EOM are normal               Nose: without d/c or deformity               Neck: Neck supple. Gross normal ROM               Cardiovascular: Normal rate and regular rhythm.                 Pulmonary/Chest: Effort normal and breath sounds without rales or wheezing.                Abd:  Soft, NT, ND, + BS, no organomegaly               Neurological: Pt is alert. At baseline orientation, motor grossly intact               Skin: Skin is warm. No rashes, no other new lesions, LE edema - none               Psychiatric: Pt behavior is normal without agitation   Micro: none  Cardiac tracings I have personally interpreted today:  none  Pertinent Radiological findings (summarize): none   Lab Results  Component Value Date   WBC 7.9  01/09/2023   HGB 13.6 01/09/2023   HCT 42.0 01/09/2023   PLT 380.0 01/09/2023   GLUCOSE 95 01/09/2023   CHOL 213 (H) 01/09/2023   TRIG 89.0 01/09/2023   HDL 78.80 01/09/2023   LDLDIRECT 116.9 03/27/2011   LDLCALC 116 (H) 01/09/2023   ALT 17 01/09/2023   AST 20 01/09/2023   NA 137 01/09/2023   K 3.8 01/09/2023   CL 101 01/09/2023   CREATININE 0.73 01/09/2023   BUN 9 01/09/2023   CO2 27 01/09/2023   TSH 1.34 01/09/2023   HGBA1C 5.3 01/09/2023   Assessment/Plan:  Mandy Watkins is a 63 y.o. White or Caucasian [1] female with  has a past medical history of ALLERGIC RHINITIS, CARPAL TUNNEL SYNDROME, RIGHT, Hyperlipidemia, SEIZURE DISORDER, Varicose vein of leg, and VITAMIN B12 DEFICIENCY (dx 06/26/2010).  Preventative health care Age and sex appropriate education and counseling updated with regular exercise and diet Referrals for preventative services - none needed Immunizations addressed - declines prevnar 20, for flu shot later this month at pharmacy Smoking counseling  - none needed Evidence for depression or other mood disorder - none significant Most recent labs reviewed. 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  Lynwood Rush, MD 01/11/2024 9:42 AM [facility]

## 2024-01-11 NOTE — Assessment & Plan Note (Signed)
 Age and sex appropriate education and counseling updated with regular exercise and diet Referrals for preventative services - none needed Immunizations addressed - declines prevnar 20, for flu shot later this month at pharmacy Smoking counseling  - none needed Evidence for depression or other mood disorder - none significant Most recent labs reviewed. I have personally reviewed and have noted: 1) the patient's medical and social history 2) The patient's current medications and supplements 3) The patient's height, weight, and BMI have been recorded in the chart

## 2024-01-11 NOTE — Assessment & Plan Note (Signed)
 Mild bilat cmc left > right - for volt gel prn

## 2024-01-11 NOTE — Assessment & Plan Note (Addendum)
 Lab Results  Component Value Date   LDLCALC 116 (H) 01/09/2023   Mild uncontrolled, for lower chol diet, had zero cardiac CT score recentl

## 2024-01-11 NOTE — Progress Notes (Signed)
 The test results show that your current treatment is OK, as the tests are stable.  Please continue the same plan.  There is no other need for change of treatment or further evaluation based on these results, at this time.  thanks

## 2025-01-13 ENCOUNTER — Encounter: Admitting: Internal Medicine
